# Patient Record
Sex: Male | Born: 2000 | Race: Asian | Hispanic: No | Marital: Single | Smoking: Never smoker
Health system: Southern US, Community
[De-identification: ages and names within clinical notes are randomized; demographics above are authoritative.]

## PROBLEM LIST (undated history)

## (undated) DIAGNOSIS — K589 Irritable bowel syndrome without diarrhea: Secondary | ICD-10-CM

## (undated) DIAGNOSIS — R109 Unspecified abdominal pain: Secondary | ICD-10-CM

## (undated) DIAGNOSIS — R1110 Vomiting, unspecified: Secondary | ICD-10-CM

## (undated) DIAGNOSIS — K219 Gastro-esophageal reflux disease without esophagitis: Secondary | ICD-10-CM

## (undated) DIAGNOSIS — D869 Sarcoidosis, unspecified: Secondary | ICD-10-CM

## (undated) HISTORY — DX: Irritable bowel syndrome, unspecified: K58.9

## (undated) HISTORY — DX: Vomiting, unspecified: R11.10

## (undated) HISTORY — PX: WISDOM TOOTH EXTRACTION: SHX21

## (undated) HISTORY — DX: Gastro-esophageal reflux disease without esophagitis: K21.9

## (undated) HISTORY — DX: Sarcoidosis, unspecified: D86.9

## (undated) HISTORY — DX: Unspecified abdominal pain: R10.9

---
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
## 2019-04-23 ENCOUNTER — Encounter: Payer: Self-pay | Admitting: *Deleted

## 2019-04-24 ENCOUNTER — Encounter

## 2019-04-24 ENCOUNTER — Telehealth: Payer: Self-pay | Admitting: General Surgery

## 2019-04-24 ENCOUNTER — Encounter: Payer: Self-pay | Admitting: Gastroenterology

## 2019-04-24 ENCOUNTER — Ambulatory Visit (INDEPENDENT_AMBULATORY_CARE_PROVIDER_SITE_OTHER): Payer: Medicaid Other | Admitting: Gastroenterology

## 2019-04-24 VITALS — Ht 64.0 in | Wt 100.0 lb

## 2019-04-24 DIAGNOSIS — K219 Gastro-esophageal reflux disease without esophagitis: Secondary | ICD-10-CM | POA: Diagnosis not present

## 2019-04-24 DIAGNOSIS — D863 Sarcoidosis of skin: Secondary | ICD-10-CM | POA: Diagnosis not present

## 2019-04-24 DIAGNOSIS — R12 Heartburn: Secondary | ICD-10-CM

## 2019-04-24 DIAGNOSIS — R112 Nausea with vomiting, unspecified: Secondary | ICD-10-CM

## 2019-04-24 MED ORDER — NA SULFATE-K SULFATE-MG SULF 17.5-3.13-1.6 GM/177ML PO SOLN: 1.0000 | Freq: Once | ORAL | 0 refills | Status: AC

## 2019-04-24 NOTE — Telephone Encounter (Signed)
Spoke with the patients mother and schedule EGD/Colon for 05/21/2019. Email instructions to patient at [email or web address]. I also mailed them the copies of the procedure instructions, and the consent form with return envelope.

## 2019-04-24 NOTE — Progress Notes (Signed)
Daniel [name]    [id number]    [birth year]  Primary Care Physician:Davis, Jenne Pane, MD  Referring Physician: Hall Busing, MD [address] [name],  [name] [address]  This service was provided via  telemedicine due to [name].  I connected with Daniel [name] on 04/24/19 at  9:30 AM EDT by a video enabled telemedicine application and verified that I am speaking with the correct person using two identifiers.  Patient location: Home Provider location: Office   I discussed the limitations, risks, security and privacy concerns of performing an evaluation and management service by video enabled telemedicine application and the availability of in person appointments. I also discussed with the patient that there may be a patient responsible charge related to this service. The patient expressed understanding and agreed to proceed.   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  His symptoms started about 4 to 6 weeks ago, was unable to keep anything down even liquids for few days.  He had to stop eating completely.  Symptoms slowly improved after he started taking Prilosec. Denies any change in bowel habits or diarrhea.  No melena or blood per rectum His grandmother had sarcoidosis but no family history of IBD or GI malignancy Denies over-the-counter herbal remedies or marijuana.  Occasional NSAID use but denies taking NSAIDs daily. He lost significant weight last month, is slowly improving.    Outpatient Encounter Medications as of 04/24/2019  Medication Sig  . omeprazole (PRILOSEC) 40 MG capsule Take 40 mg by mouth daily.  . Probiotic Product (PROBIOTIC DAILY PO) Take by mouth.   No facility-administered encounter medications on  file as of 04/24/2019.     Allergies as of 04/24/2019  . (No Known Allergies)    Past Medical History:  Diagnosis Date  . Abdominal pain, recurrent   . GERD (gastroesophageal reflux disease)   . Premature birth   . Vomiting     History reviewed. No pertinent surgical history.  Family History  Problem Relation Age of Onset  . GER disease Paternal Grandmother     Social History   Socioeconomic History  . Marital status: Single    Spouse name: Not on file  . Number of children: Not on file  . Years of education: Not on file  . Highest education level: Not on file  Occupational History  . Not on file  Social Needs  . Financial resource strain: Not on file  . Food insecurity    Worry: Not on file    Inability: Not on file  . Transportation needs    Medical: Not on file    Non-medical: Not on file  Tobacco Use  . Smoking status: Never Smoker  . Smokeless tobacco: Never Used  Substance and Sexual Activity  . Alcohol use: Not on file  . Drug use: No  . Sexual activity: Never  Lifestyle  . Physical activity    Days per week: Not on file    Minutes per session: Not on file  . Stress: Not on file  Relationships  . Social Herbalist on phone: Not on file    Gets together: Not on file    Attends religious service: Not on file  Active member of club or organization: Not on file    Attends meetings of clubs or organizations: Not on file    Relationship status: Not on file  . Intimate partner violence    Fear of current or ex partner: Not on file    Emotionally abused: Not on file    Physically abused: Not on file    Forced sexual activity: Not on file  Other Topics Concern  . Not on file  Social History Narrative   6th grade      Review of systems: Review of Systems as per HPI All other systems reviewed and are negative.   Observations/Objective:   Data Reviewed:  Reviewed labs, radiology imaging, old records and pertinent past GI work up    Assessment and Plan/Recommendations: 18 year old male with cutaneous sarcoidosis complains of severe heartburn, nausea and intractable vomiting ?  GI involvement of sarcoid We will schedule for EGD and colonoscopy with biopsies for further evaluation Continue omeprazole 40 mg daily, 30 minutes before breakfast Discussed antireflux measures and lifestyle modifications in detail  The risks and benefits as well as alternatives of endoscopic procedure(s) have been discussed and reviewed. All questions answered. The patient agrees to proceed.  I discussed the assessment and treatment plan with the patient. The patient was provided an opportunity to ask questions and all were answered. The patient agreed with the plan and demonstrated an understanding of the instructions.   The patient was advised to call back or seek an in-person evaluation if the symptoms worsen or if the condition fails to improve as anticipated.     Marsa ArisKavitha , MD   CC: Estrella Myrtleavis, William B, MD

## 2019-04-24 NOTE — Patient Instructions (Addendum)
Continue omeprazole 40 mg daily, 30 minutes before breakfast  Antireflux measures  Schedule for EGD and colonoscopy with biopsy    Gastroesophageal Reflux Disease, Adult Gastroesophageal reflux (GER) happens when acid from the stomach flows up into the tube that connects the mouth and the stomach (esophagus). Normally, food travels down the esophagus and stays in the stomach to be digested. However, when a person has GER, food and stomach acid sometimes move back up into the esophagus. If this becomes a more serious problem, the person may be diagnosed with a disease called gastroesophageal reflux disease (GERD). GERD occurs when the reflux:  Happens often.  Causes frequent or severe symptoms.  Causes problems such as damage to the esophagus. When stomach acid comes in contact with the esophagus, the acid may cause soreness (inflammation) in the esophagus. Over time, GERD may create small holes (ulcers) in the lining of the esophagus. What are the causes? This condition is caused by a problem with the muscle between the esophagus and the stomach (lower esophageal sphincter, or LES). Normally, the LES muscle closes after food passes through the esophagus to the stomach. When the LES is weakened or abnormal, it does not close properly, and that allows food and stomach acid to go back up into the esophagus. The LES can be weakened by certain dietary substances, medicines, and medical conditions, including:  Tobacco use.  Pregnancy.  Having a hiatal hernia.  Alcohol use.  Certain foods and beverages, such as coffee, chocolate, onions, and peppermint. What increases the risk? You are more likely to develop this condition if you:  Have an increased body weight.  Have a connective tissue disorder.  Use NSAID medicines. What are the signs or symptoms? Symptoms of this condition include:  Heartburn.  Difficult or painful swallowing.  The feeling of having a lump in the  throat.  Abitter taste in the mouth.  Bad breath.  Having a large amount of saliva.  Having an upset or bloated stomach.  Belching.  Chest pain. Different conditions can cause chest pain. Make sure you see your health care provider if you experience chest pain.  Shortness of breath or wheezing.  Ongoing (chronic) cough or a night-time cough.  Wearing away of tooth enamel.  Weight loss. How is this diagnosed? Your health care provider will take a medical history and perform a physical exam. To determine if you have mild or severe GERD, your health care provider may also monitor how you respond to treatment. You may also have tests, including:  A test to examine your stomach and esophagus with a small camera (endoscopy).  A test thatmeasures the acidity level in your esophagus.  A test thatmeasures how much pressure is on your esophagus.  A barium swallow or modified barium swallow test to show the shape, size, and functioning of your esophagus. How is this treated? The goal of treatment is to help relieve your symptoms and to prevent complications. Treatment for this condition may vary depending on how severe your symptoms are. Your health care provider may recommend:  Changes to your diet.  Medicine.  Surgery. Follow these instructions at home: Eating and drinking   Follow a diet as recommended by your health care provider. This may involve avoiding foods and drinks such as: ? Coffee and tea (with or without caffeine). ? Drinks that containalcohol. ? Energy drinks and sports drinks. ? Carbonated drinks or sodas. ? Chocolate and cocoa. ? Peppermint and mint flavorings. ? Garlic and onions. ? Horseradish. ?  Spicy and acidic foods, including peppers, chili powder, curry powder, vinegar, hot sauces, and barbecue sauce. ? Citrus fruit juices and citrus fruits, such as oranges, lemons, and limes. ? Tomato-based foods, such as red sauce, chili, salsa, and pizza with  red sauce. ? Fried and fatty foods, such as donuts, french fries, potato chips, and high-fat dressings. ? High-fat meats, such as hot dogs and fatty cuts of red and white meats, such as rib eye steak, sausage, ham, and bacon. ? High-fat dairy items, such as whole milk, butter, and cream cheese.  Eat small, frequent meals instead of large meals.  Avoid drinking large amounts of liquid with your meals.  Avoid eating meals during the 2-3 hours before bedtime.  Avoid lying down right after you eat.  Do not exercise right after you eat. Lifestyle   Do not use any products that contain nicotine or tobacco, such as cigarettes, e-cigarettes, and chewing tobacco. If you need help quitting, ask your health care provider.  Try to reduce your stress by using methods such as yoga or meditation. If you need help reducing stress, ask your health care provider.  If you are overweight, reduce your weight to an amount that is healthy for you. Ask your health care provider for guidance about a safe weight loss goal. General instructions  Pay attention to any changes in your symptoms.  Take over-the-counter and prescription medicines only as told by your health care provider. Do not take aspirin, ibuprofen, or other NSAIDs unless your health care provider told you to do so.  Wear loose-fitting clothing. Do not wear anything tight around your waist that causes pressure on your abdomen.  Raise (elevate) the head of your bed about 6 inches (15 cm).  Avoid bending over if this makes your symptoms worse.  Keep all follow-up visits as told by your health care provider. This is important. Contact a health care provider if:  You have: ? New symptoms. ? Unexplained weight loss. ? Difficulty swallowing or it hurts to swallow. ? Wheezing or a persistent cough. ? A hoarse voice.  Your symptoms do not improve with treatment. Get help right away if you:  Have pain in your arms, neck, jaw, teeth, or  back.  Feel sweaty, dizzy, or light-headed.  Have chest pain or shortness of breath.  Vomit and your vomit looks like blood or coffee grounds.  Faint.  Have stool that is bloody or black.  Cannot swallow, drink, or eat. Summary  Gastroesophageal reflux happens when acid from the stomach flows up into the esophagus. GERD is a disease in which the reflux happens often, causes frequent or severe symptoms, or causes problems such as damage to the esophagus.  Treatment for this condition may vary depending on how severe your symptoms are. Your health care provider may recommend diet and lifestyle changes, medicine, or surgery.  Contact a health care provider if you have new or worsening symptoms.  Take over-the-counter and prescription medicines only as told by your health care provider. Do not take aspirin, ibuprofen, or other NSAIDs unless your health care provider told you to do so.  Keep all follow-up visits as told by your health care provider. This is important. This information is not intended to replace advice given to you by your health care provider. Make sure you discuss any questions you have with your health care provider. Document Released: 06/21/2005 Document Revised: 03/20/2018 Document Reviewed: 03/20/2018 Elsevier Patient Education  2020 [employer]   I appreciate the  opportunity to care for you  Thank You   Harl Bowie , MD

## 2019-04-28 DIAGNOSIS — K219 Gastro-esophageal reflux disease without esophagitis: Secondary | ICD-10-CM | POA: Insufficient documentation

## 2019-05-20 ENCOUNTER — Telehealth: Payer: Self-pay | Admitting: Gastroenterology

## 2019-05-20 NOTE — Telephone Encounter (Signed)
Spoke with patient regarding Covid-19 screening questions. °Covid-19 Screening Questions: ° °Do you now or have you had a fever in the last 14 days?  ° °Do you have any respiratory symptoms of shortness of breath or cough now or in the last 14 days?  ° °Do you have any family members or close contacts with diagnosed or suspected Covid-19 in the past 14 days?  ° °Have you been tested for Covid-19 and found to be positive?  ° °Pt made aware of that care partner may wait in the car or come up to the lobby during the procedure but will need to provide their own mask. °

## 2019-05-21 ENCOUNTER — Other Ambulatory Visit: Payer: Self-pay

## 2019-05-21 ENCOUNTER — Ambulatory Visit (AMBULATORY_SURGERY_CENTER): Payer: Medicaid Other | Admitting: Gastroenterology

## 2019-05-21 ENCOUNTER — Encounter: Payer: Self-pay | Admitting: Gastroenterology

## 2019-05-21 VITALS — BP 94/61 | HR 58 | Temp 97.8°F | Resp 14 | Ht 64.0 in | Wt 100.0 lb

## 2019-05-21 DIAGNOSIS — R12 Heartburn: Secondary | ICD-10-CM

## 2019-05-21 DIAGNOSIS — D863 Sarcoidosis of skin: Secondary | ICD-10-CM

## 2019-05-21 DIAGNOSIS — K2970 Gastritis, unspecified, without bleeding: Secondary | ICD-10-CM | POA: Diagnosis not present

## 2019-05-21 DIAGNOSIS — R112 Nausea with vomiting, unspecified: Secondary | ICD-10-CM | POA: Diagnosis not present

## 2019-05-21 DIAGNOSIS — K6289 Other specified diseases of anus and rectum: Secondary | ICD-10-CM | POA: Diagnosis not present

## 2019-05-21 DIAGNOSIS — R634 Abnormal weight loss: Secondary | ICD-10-CM | POA: Diagnosis not present

## 2019-05-21 DIAGNOSIS — R627 Adult failure to thrive: Secondary | ICD-10-CM

## 2019-05-21 MED ORDER — SODIUM CHLORIDE 0.9 % IV SOLN: 500.0000 mL | Freq: Once | INTRAVENOUS | Status: DC

## 2019-05-21 NOTE — Op Note (Signed)
[facility] Patient Name: Daniel [name] Procedure Date: 05/21/2019 3:34 PM MRN: [id number] Endoscopist: Mauri Pole , MD Age: 18 Referring MD:  Date of Birth: 15-Aug-2001 Gender: Male Account #: [id number] Procedure:                Upper GI endoscopy Indications:              Persistent vomiting of unknown cause, Heartburn,                            history of sarcoidosis Medicines:                Monitored Anesthesia Care Procedure:                Pre-Anesthesia Assessment:                           - Prior to the procedure, a History and Physical                            was performed, and patient medications and                            allergies were reviewed. The patient's tolerance of                            previous anesthesia was also reviewed. The risks                            and benefits of the procedure and the sedation                            options and risks were discussed with the patient.                            All questions were answered, and informed consent                            was obtained. Prior Anticoagulants: The patient has                            taken no previous anticoagulant or antiplatelet                            agents. ASA Grade Assessment: II - A patient with                            mild systemic disease. After reviewing the risks                            and benefits, the patient was deemed in                            satisfactory condition to undergo the procedure.  After obtaining informed consent, the endoscope was                            passed under direct vision. Throughout the                            procedure, the patient's blood pressure, pulse, and                            oxygen saturations were monitored continuously. The                            Endoscope was introduced through the mouth, and                            advanced to the second part of  duodenum. The upper                            GI endoscopy was accomplished without difficulty.                            The patient tolerated the procedure well. Scope In: Scope Out: Findings:                 The esophagus was normal.                           Patchy mild inflammation characterized by                            congestion (edema) and erythema was found in the                            entire examined stomach. Biopsies were taken with a                            cold forceps for Helicobacter pylori testing.                           The first portion of the duodenum and second                            portion of the duodenum were normal. Biopsies were                            taken with a cold forceps for histology. Complications:            No immediate complications. Estimated Blood Loss:     Estimated blood loss was minimal. Impression:               - Normal esophagus.                           - Gastritis. Biopsied.                           -  Normal first portion of the duodenum and second                            portion of the duodenum. Biopsied. Recommendation:           - Resume previous diet.                           - Continue present medications.                           - Await pathology results. Napoleon Form, MD 05/21/2019 4:06:18 PM This report has been signed electronically.

## 2019-05-21 NOTE — Progress Notes (Signed)
Called to room to assist during endoscopic procedure.  Patient ID and intended procedure confirmed with present staff. Received instructions for my participation in the procedure from the performing physician.  

## 2019-05-21 NOTE — Progress Notes (Signed)
PT taken to PACU. Monitors in place. VSS. Report given to RN. 

## 2019-05-21 NOTE — Op Note (Signed)
[facility] Patient Name: Daniel SouSahil Veldman Procedure Date: 05/21/2019 3:34 PM MRN: [id number] Endoscopist: Napoleon FormKavitha V. Nandigam , MD Age: [age] Referring MD:  Date of Birth: October 26, 2000 Gender: Male Account #: [id number] Procedure:                Colonoscopy Indications:              Generalized abdominal pain, Failure to thrive,                            Weight loss, history of sarcoidosis Medicines:                Monitored Anesthesia Care Procedure:                Pre-Anesthesia Assessment:                           - Prior to the procedure, a History and Physical                            was performed, and patient medications and                            allergies were reviewed. The patient's tolerance of                            previous anesthesia was also reviewed. The risks                            and benefits of the procedure and the sedation                            options and risks were discussed with the patient.                            All questions were answered, and informed consent                            was obtained. Prior Anticoagulants: The patient has                            taken no previous anticoagulant or antiplatelet                            agents. ASA Grade Assessment: II - A patient with                            mild systemic disease. After reviewing the risks                            and benefits, the patient was deemed in                            satisfactory condition to undergo the procedure.  After obtaining informed consent, the colonoscope                            was passed under direct vision. Throughout the                            procedure, the patient's blood pressure, pulse, and                            oxygen saturations were monitored continuously. The                            Colonoscope was introduced through the anus and                            advanced to the the  terminal ileum, with                            identification of the appendiceal orifice and IC                            valve. The colonoscopy was performed without                            difficulty. The patient tolerated the procedure                            well. The quality of the bowel preparation was                            adequate. The terminal ileum, ileocecal valve,                            appendiceal orifice, and rectum were photographed. Scope In: 3:45:54 PM Scope Out: 3:57:52 PM Scope Withdrawal Time: 0 hours 8 minutes 3 seconds  Total Procedure Duration: 0 hours 11 minutes 58 seconds  Findings:                 The perianal and digital rectal examinations were                            normal.                           A patchy area of mildly erythematous mucosa was                            found in the rectum. Biopsies were taken with a                            cold forceps for histology.                           The exam was otherwise without abnormality, rest of  colon appeared normal. Biopsies were taken with a                            cold forceps for histology.                           The terminal ileum appeared normal. Biopsies were                            taken with a cold forceps for histology. Complications:            No immediate complications. Estimated Blood Loss:     Estimated blood loss was minimal. Impression:               - Erythematous mucosa in the rectum. Biopsied.                           - The examination was otherwise normal.                           - The examined portion of the ileum was normal.                            Biopsied. Recommendation:           - Patient has a contact number available for                            emergencies. The signs and symptoms of potential                            delayed complications were discussed with the                            patient. Return  to normal activities tomorrow.                            Written discharge instructions were provided to the                            patient.                           - Resume previous diet.                           - Continue present medications.                           - Await pathology results.                           - Repeat colonoscopy date to be determined after                            pending pathology results are reviewed for  surveillance based on pathology results.                           - Return to GI clinic at the next available                            appointment. Napoleon Form, MD 05/21/2019 4:12:09 PM This report has been signed electronically.

## 2019-05-21 NOTE — Patient Instructions (Signed)
YOU HAD AN ENDOSCOPIC PROCEDURE TODAY AT THE [facility]:   Refer to the procedure report that was given to you for any specific questions about what was found during the examination.  If the procedure report does not answer your questions, please call your gastroenterologist to clarify.  If you requested that your care partner not be given the details of your procedure findings, then the procedure report has been included in a sealed envelope for you to review at your convenience later.  YOU SHOULD EXPECT: Some feelings of bloating in the abdomen. Passage of more gas than usual.  Walking can help get rid of the air that was put into your GI tract during the procedure and reduce the bloating. If you had a lower endoscopy (such as a colonoscopy or flexible sigmoidoscopy) you may notice spotting of blood in your stool or on the toilet paper. If you underwent a bowel prep for your procedure, you may not have a normal bowel movement for a few days.  Please Note:  You might notice some irritation and congestion in your nose or some drainage.  This is from the oxygen used during your procedure.  There is no need for concern and it should clear up in a day or so.  SYMPTOMS TO REPORT IMMEDIATELY:   Following lower endoscopy (colonoscopy or flexible sigmoidoscopy):  Excessive amounts of blood in the stool  Significant tenderness or worsening of abdominal pains  Swelling of the abdomen that is new, acute  Fever of 100F or higher   Following upper endoscopy (EGD)  Vomiting of blood or coffee ground material  New chest pain or pain under the shoulder blades  Painful or persistently difficult swallowing  New shortness of breath  Fever of 100F or higher  Black, tarry-looking stools  For urgent or emergent issues, a gastroenterologist can be reached at any hour by calling [phone].   DIET:  We do recommend a small meal at first, but then you may proceed to your regular diet.  Drink  plenty of fluids but you should avoid alcoholic beverages for 24 hours.  ACTIVITY:  You should plan to take it easy for the rest of today and you should NOT DRIVE or use heavy machinery until tomorrow (because of the sedation medicines used during the test).    FOLLOW UP: Our staff will call the number listed on your records 48-72 hours following your procedure to check on you and address any questions or concerns that you may have regarding the information given to you following your procedure. If we do not reach you, we will leave a message.  We will attempt to reach you two times.  During this call, we will ask if you have developed any symptoms of COVID 19. If you develop any symptoms (ie: fever, flu-like symptoms, shortness of breath, cough etc.) before then, please call [phone].  If you test positive for Covid 19 in the 2 weeks post procedure, please call and report this information to us.    If any biopsies were taken you will be contacted by phone or by letter within the next 1-3 weeks.  Please call us at [phone] if you have not heard about the biopsies in 3 weeks.    SIGNATURES/CONFIDENTIALITY: You and/or your care partner have signed paperwork which will be entered into your electronic medical record.  These signatures attest to the fact that that the information above on your After Visit Summary has been reviewed and is   understood.  Full responsibility of the confidentiality of this discharge information lies with you and/or your care-partner.  Await pathology  Please read over handout about gastritis  Please continue your normal medications  Dr. Woodward Ku office nurse will call you to set up follow up office visit

## 2019-05-21 NOTE — Progress Notes (Signed)
VS done by CW. Covid screening and temp done by June. 

## 2019-05-23 ENCOUNTER — Telehealth: Payer: Self-pay | Admitting: *Deleted

## 2019-05-23 NOTE — Telephone Encounter (Signed)
  Follow up Call-  Call back number 05/21/2019  Post procedure Call Back phone  # [phone]  Permission to leave phone message Yes  Some recent data might be hidden   spoke with mother  Patient questions:  Do you have a fever, pain , or abdominal swelling? No. Pain Score  0 *  Have you tolerated food without any problems? Yes.    Have you been able to return to your normal activities? Yes.    Do you have any questions about your discharge instructions: Diet   No. Medications  No. Follow up visit  No.  Do you have questions or concerns about your Care? No.  Actions: * If pain score is 4 or above: No action needed, pain <4.  1. Have you developed a fever since your procedure? no  2.   Have you had an respiratory symptoms (SOB or cough) since your procedure? no  3.   Have you tested positive for COVID 19 since your procedure no  4.   Have you had any family members/close contacts diagnosed with the COVID 19 since your procedure?  no   If yes to any of these questions please route to Joylene John, RN and Alphonsa Gin, Therapist, sports.

## 2019-05-27 ENCOUNTER — Other Ambulatory Visit: Payer: Self-pay

## 2019-06-04 ENCOUNTER — Encounter: Payer: Self-pay | Admitting: Gastroenterology

## 2019-07-01 ENCOUNTER — Encounter: Payer: Self-pay | Admitting: Gastroenterology

## 2019-07-01 ENCOUNTER — Ambulatory Visit (INDEPENDENT_AMBULATORY_CARE_PROVIDER_SITE_OTHER): Payer: Medicaid Other | Admitting: Gastroenterology

## 2019-07-01 ENCOUNTER — Other Ambulatory Visit: Payer: Self-pay

## 2019-07-01 VITALS — BP 90/60 | HR 72 | Temp 98.2°F | Ht 64.0 in | Wt 99.0 lb

## 2019-07-01 DIAGNOSIS — K219 Gastro-esophageal reflux disease without esophagitis: Secondary | ICD-10-CM | POA: Diagnosis not present

## 2019-07-01 DIAGNOSIS — R1013 Epigastric pain: Secondary | ICD-10-CM | POA: Diagnosis not present

## 2019-07-01 MED ORDER — OMEPRAZOLE 20 MG PO CPDR: 20.0000 mg | DELAYED_RELEASE_CAPSULE | Freq: Every day | ORAL | 3 refills | Status: DC

## 2019-07-01 NOTE — Progress Notes (Signed)
Daniel [name]    [id number]    Jan 19, 2001  Primary Care Physician:Davis, Jenne Pane, MD  Referring Physician: Hall Busing, MD [address] [place],  [place] [address]   Chief complaint:  GERD  HPI:  18 yr M with h/o sarcoidosis is here for follow up visit  He is doing well overall. He has gained 4-5 lbs in the past few months.   Continues to have intermittent nausea mostly in the morning and is unable to eat anything solid for couple hours after he wakes up.  He tolerates liquids fine in the morning.  Denies any nausea, vomiting, abdominal pain, melena or bright red blood per rectum  EGD May 21, 2019: Mild gastritis otherwise normal.  Biopsies negative for H. pylori or celiac sprue  Colonoscopy May 21, 2019: Normal.  Normal TI and colon biopsies  Outpatient Encounter Medications as of 07/01/2019  Medication Sig  . omeprazole (PRILOSEC) 40 MG capsule Take 40 mg by mouth daily.  . Probiotic Product (PROBIOTIC DAILY PO) Take by mouth.   Facility-Administered Encounter Medications as of 07/01/2019  Medication  . 0.9 %  sodium chloride infusion    Allergies as of 07/01/2019  . (No Known Allergies)    Past Medical History:  Diagnosis Date  . Abdominal pain, recurrent   . GERD (gastroesophageal reflux disease)   . Premature birth   . Vomiting     No past surgical history on file.  Family History  Problem Relation Age of Onset  . GER disease Paternal Grandmother   . Colon cancer Neg Hx   . Esophageal cancer Neg Hx   . Rectal cancer Neg Hx   . Stomach cancer Neg Hx     Social History   Socioeconomic History  . Marital status: Single    Spouse name: Not on file  . Number of children: Not on file  . Years of education: Not on file  . Highest education level: Not on file  Occupational History  . Not on file  Social Needs  . Financial resource strain: Not on file  . Food insecurity    Worry: Not on file    Inability: Not on file   . Transportation needs    Medical: Not on file    Non-medical: Not on file  Tobacco Use  . Smoking status: Never Smoker  . Smokeless tobacco: Never Used  Substance and Sexual Activity  . Alcohol use: Never    Frequency: Never  . Drug use: No  . Sexual activity: Never  Lifestyle  . Physical activity    Days per week: Not on file    Minutes per session: Not on file  . Stress: Not on file  Relationships  . Social Herbalist on phone: Not on file    Gets together: Not on file    Attends religious service: Not on file    Active member of club or organization: Not on file    Attends meetings of clubs or organizations: Not on file    Relationship status: Not on file  . Intimate partner violence    Fear of current or ex partner: Not on file    Emotionally abused: Not on file    Physically abused: Not on file    Forced sexual activity: Not on file  Other Topics Concern  . Not on file  Social History Narrative   6th grade  Review of systems: Review of Systems  Constitutional: Negative for fever and chills.  HENT: Negative.   Eyes: Negative for blurred vision.  Respiratory: Negative for cough, shortness of breath and wheezing.   Cardiovascular: Negative for chest pain and palpitations.  Gastrointestinal: as per HPI Genitourinary: Negative for dysuria, urgency, frequency and hematuria.  Musculoskeletal: Negative for myalgias, back pain and joint pain.  Skin: Negative for itching and rash.  Neurological: Negative for dizziness, tremors, focal weakness, seizures and loss of consciousness.  Endo/Heme/Allergies: Positive for seasonal allergies.  Psychiatric/Behavioral: Negative for depression, suicidal ideas and hallucinations.  All other systems reviewed and are negative.   Physical Exam: Vitals:   07/01/19 0837  BP: 90/60  Pulse: 72  Temp: 98.2 F (36.8 C)   Body mass index is 16.99 kg/m. Gen:      No acute distress HEENT:  EOMI, sclera anicteric  Neck:     No masses; no thyromegaly Lungs:    Clear to auscultation bilaterally; normal respiratory effort CV:         Regular rate and rhythm; no murmurs Abd:      + bowel sounds; soft, non-tender; no palpable masses, no distension Ext:    No edema; adequate peripheral perfusion Skin:      Warm and dry; no rash Neuro: alert and oriented x 3 Psych: normal mood and affect  Data Reviewed:  Reviewed labs, radiology imaging, old records and pertinent past GI work up   Assessment and Plan/Recommendations:  18 year old male with history of sarcoidosis with GERD and dyspepsia  Omeprazole 40 mg daily, will plan to slowly taper it down as his symptoms improve  Small frequent meals  Discussed antireflux measures and lifestyle modifications in detail  Return in 1 year or sooner if needed  15 minutes was spent face-to-face with the patient. Greater than 50% of the time used for counseling as well as treatment plan and follow-up.   Iona Beard , MD    CC: Estrella Myrtle, MD

## 2019-07-01 NOTE — Patient Instructions (Signed)
We refilled your Omeprazole today  Eat Small frequent meals   Gastroesophageal Reflux Disease, Adult Gastroesophageal reflux (GER) happens when acid from the stomach flows up into the tube that connects the mouth and the stomach (esophagus). Normally, food travels down the esophagus and stays in the stomach to be digested. However, when a person has GER, food and stomach acid sometimes move back up into the esophagus. If this becomes a more serious problem, the person may be diagnosed with a disease called gastroesophageal reflux disease (GERD). GERD occurs when the reflux:  Happens often.  Causes frequent or severe symptoms.  Causes problems such as damage to the esophagus. When stomach acid comes in contact with the esophagus, the acid may cause soreness (inflammation) in the esophagus. Over time, GERD may create small holes (ulcers) in the lining of the esophagus. What are the causes? This condition is caused by a problem with the muscle between the esophagus and the stomach (lower esophageal sphincter, or LES). Normally, the LES muscle closes after food passes through the esophagus to the stomach. When the LES is weakened or abnormal, it does not close properly, and that allows food and stomach acid to go back up into the esophagus. The LES can be weakened by certain dietary substances, medicines, and medical conditions, including:  Tobacco use.  Pregnancy.  Having a hiatal hernia.  Alcohol use.  Certain foods and beverages, such as coffee, chocolate, onions, and peppermint. What increases the risk? You are more likely to develop this condition if you:  Have an increased body weight.  Have a connective tissue disorder.  Use NSAID medicines. What are the signs or symptoms? Symptoms of this condition include:  Heartburn.  Difficult or painful swallowing.  The feeling of having a lump in the throat.  Abitter taste in the mouth.  Bad breath.  Having a large amount of  saliva.  Having an upset or bloated stomach.  Belching.  Chest pain. Different conditions can cause chest pain. Make sure you see your health care provider if you experience chest pain.  Shortness of breath or wheezing.  Ongoing (chronic) cough or a night-time cough.  Wearing away of tooth enamel.  Weight loss. How is this diagnosed? Your health care provider will take a medical history and perform a physical exam. To determine if you have mild or severe GERD, your health care provider may also monitor how you respond to treatment. You may also have tests, including:  A test to examine your stomach and esophagus with a small camera (endoscopy).  A test thatmeasures the acidity level in your esophagus.  A test thatmeasures how much pressure is on your esophagus.  A barium swallow or modified barium swallow test to show the shape, size, and functioning of your esophagus. How is this treated? The goal of treatment is to help relieve your symptoms and to prevent complications. Treatment for this condition may vary depending on how severe your symptoms are. Your health care provider may recommend:  Changes to your diet.  Medicine.  Surgery. Follow these instructions at home: Eating and drinking   Follow a diet as recommended by your health care provider. This may involve avoiding foods and drinks such as: ? Coffee and tea (with or without caffeine). ? Drinks that containalcohol. ? Energy drinks and sports drinks. ? Carbonated drinks or sodas. ? Chocolate and cocoa. ? Peppermint and mint flavorings. ? Garlic and onions. ? Horseradish. ? Spicy and acidic foods, including peppers, chili powder, curry powder,  vinegar, hot sauces, and barbecue sauce. ? Citrus fruit juices and citrus fruits, such as oranges, lemons, and limes. ? Tomato-based foods, such as red sauce, chili, salsa, and pizza with red sauce. ? Fried and fatty foods, such as donuts, french fries, potato chips,  and high-fat dressings. ? High-fat meats, such as hot dogs and fatty cuts of red and white meats, such as rib eye steak, sausage, ham, and bacon. ? High-fat dairy items, such as whole milk, butter, and cream cheese.  Eat small, frequent meals instead of large meals.  Avoid drinking large amounts of liquid with your meals.  Avoid eating meals during the 2-3 hours before bedtime.  Avoid lying down right after you eat.  Do not exercise right after you eat. Lifestyle   Do not use any products that contain nicotine or tobacco, such as cigarettes, e-cigarettes, and chewing tobacco. If you need help quitting, ask your health care provider.  Try to reduce your stress by using methods such as yoga or meditation. If you need help reducing stress, ask your health care provider.  If you are overweight, reduce your weight to an amount that is healthy for you. Ask your health care provider for guidance about a safe weight loss goal. General instructions  Pay attention to any changes in your symptoms.  Take over-the-counter and prescription medicines only as told by your health care provider. Do not take aspirin, ibuprofen, or other NSAIDs unless your health care provider told you to do so.  Wear loose-fitting clothing. Do not wear anything tight around your waist that causes pressure on your abdomen.  Raise (elevate) the head of your bed about 6 inches (15 cm).  Avoid bending over if this makes your symptoms worse.  Keep all follow-up visits as told by your health care provider. This is important. Contact a health care provider if:  You have: ? New symptoms. ? Unexplained weight loss. ? Difficulty swallowing or it hurts to swallow. ? Wheezing or a persistent cough. ? A hoarse voice.  Your symptoms do not improve with treatment. Get help right away if you:  Have pain in your arms, neck, jaw, teeth, or back.  Feel sweaty, dizzy, or light-headed.  Have chest pain or shortness of  breath.  Vomit and your vomit looks like blood or coffee grounds.  Faint.  Have stool that is bloody or black.  Cannot swallow, drink, or eat. Summary  Gastroesophageal reflux happens when acid from the stomach flows up into the esophagus. GERD is a disease in which the reflux happens often, causes frequent or severe symptoms, or causes problems such as damage to the esophagus.  Treatment for this condition may vary depending on how severe your symptoms are. Your health care provider may recommend diet and lifestyle changes, medicine, or surgery.  Contact a health care provider if you have new or worsening symptoms.  Take over-the-counter and prescription medicines only as told by your health care provider. Do not take aspirin, ibuprofen, or other NSAIDs unless your health care provider told you to do so.  Keep all follow-up visits as told by your health care provider. This is important. This information is not intended to replace advice given to you by your health care provider. Make sure you discuss any questions you have with your health care provider. Document Released: 06/21/2005 Document Revised: 03/20/2018 Document Reviewed: 03/20/2018 Elsevier Patient Education  2020 [employer].   Food Choices for Gastroesophageal Reflux Disease, Adult When you have gastroesophageal reflux disease (  GERD), the foods you eat and your eating habits are very important. Choosing the right foods can help ease your discomfort. Think about working with a nutrition specialist (dietitian) to help you make good choices. What are tips for following this plan?  Meals  Choose healthy foods that are low in fat, such as fruits, vegetables, whole grains, low-fat dairy products, and lean meat, fish, and poultry.  Eat small meals often instead of 3 large meals a day. Eat your meals slowly, and in a place where you are relaxed. Avoid bending over or lying down until 2-3 hours after eating.  Avoid eating  meals 2-3 hours before bed.  Avoid drinking a lot of liquid with meals.  Cook foods using methods other than frying. Bake, grill, or broil food instead.  Avoid or limit: ? Chocolate. ? Peppermint or spearmint. ? Alcohol. ? Pepper. ? Black and decaffeinated coffee. ? Black and decaffeinated tea. ? Bubbly (carbonated) soft drinks. ? Caffeinated energy drinks and soft drinks.  Limit high-fat foods such as: ? Fatty meat or fried foods. ? Whole milk, cream, butter, or ice cream. ? Nuts and nut butters. ? Pastries, donuts, and sweets made with butter or shortening.  Avoid foods that cause symptoms. These foods may be different for everyone. Common foods that cause symptoms include: ? Tomatoes. ? Oranges, lemons, and limes. ? Peppers. ? Spicy food. ? Onions and garlic. ? Vinegar. Lifestyle  Maintain a healthy weight. Ask your doctor what weight is healthy for you. If you need to lose weight, work with your doctor to do so safely.  Exercise for at least 30 minutes for 5 or more days each week, or as told by your doctor.  Wear loose-fitting clothes.  Do not smoke. If you need help quitting, ask your doctor.  Sleep with the head of your bed higher than your feet. Use a wedge under the mattress or blocks under the bed frame to raise the head of the bed. Summary  When you have gastroesophageal reflux disease (GERD), food and lifestyle choices are very important in easing your symptoms.  Eat small meals often instead of 3 large meals a day. Eat your meals slowly, and in a place where you are relaxed.  Limit high-fat foods such as fatty meat or fried foods.  Avoid bending over or lying down until 2-3 hours after eating.  Avoid peppermint and spearmint, caffeine, alcohol, and chocolate. This information is not intended to replace advice given to you by your health care provider. Make sure you discuss any questions you have with your health care provider. Document Released:  03/12/2012 Document Revised: 01/02/2019 Document Reviewed: 10/17/2016 Elsevier Patient Education  [employer].   I appreciate the  opportunity to care for you  Thank You   Harl Bowie , MD

## 2019-07-03 ENCOUNTER — Encounter: Payer: Self-pay | Admitting: Gastroenterology

## 2020-05-10 ENCOUNTER — Other Ambulatory Visit: Payer: Self-pay | Admitting: Dermatology

## 2022-02-07 ENCOUNTER — Ambulatory Visit: Payer: Medicaid Other | Admitting: Internal Medicine

## 2022-02-07 ENCOUNTER — Encounter: Payer: Self-pay | Admitting: Internal Medicine

## 2022-02-07 DIAGNOSIS — D863 Sarcoidosis of skin: Secondary | ICD-10-CM | POA: Diagnosis not present

## 2022-02-07 DIAGNOSIS — K219 Gastro-esophageal reflux disease without esophagitis: Secondary | ICD-10-CM

## 2022-02-07 MED ORDER — OMEPRAZOLE 40 MG PO CPDR: 40.0000 mg | DELAYED_RELEASE_CAPSULE | Freq: Every day | ORAL | 1 refills | Status: DC

## 2022-02-07 NOTE — Progress Notes (Signed)
? ? ?Subjective:  ? ?Patient ID: Daniel [name] male   DOB: 2001/01/25 21 y.o.   MRN: [id number] ? ?HPI: ?[name] is a 21 y.o. with a PMHx of GERD and subcutaneous sarcoidosis who presents to establish care. Please see problem-based charting for full assessment and plan. ? ?Past Medical History:  ?Diagnosis Date  ? Abdominal pain, recurrent   ? GERD (gastroesophageal reflux disease)   ? Premature birth   ? Sarcoidosis   ? Subcutaneous  ? Vomiting   ? ?Current Outpatient Medications  ?Medication Sig Dispense Refill  ? ketoconazole (NIZORAL) 2 % cream Apply to affected area on chest and back twice daily for tinea versicolor. NEEDS APPT FOR FURTHER REFILLS 30 g 0  ? omeprazole (PRILOSEC) 20 MG capsule Take 1 capsule (20 mg total) by mouth daily. 90 capsule 3  ? omeprazole (PRILOSEC) 40 MG capsule Take 40 mg by mouth daily.    ? Probiotic Product (PROBIOTIC DAILY PO) Take by mouth.    ? ?Current Facility-Administered Medications  ?Medication Dose Route Frequency Provider Last Rate Last Admin  ? 0.9 %  sodium chloride infusion  500 mL Intravenous Once Nandigam, Eleonore Chiquito, MD      ? ?Family History  ?Problem Relation Age of Onset  ? Hypertension Maternal Grandmother   ? Sarcoidosis Maternal Grandmother   ? Diabetes Maternal Grandmother   ? Hyperlipidemia Maternal Grandmother   ? COPD Maternal Grandmother   ? Prostate cancer Maternal Grandfather   ? Neuropathy Maternal Grandfather   ? GER disease Paternal Grandmother   ? Hypertension Paternal Grandmother   ? Hyperlipidemia Paternal Grandmother   ? Thyroid disease Paternal Grandmother   ? Heart attack Paternal Grandfather   ? Colon cancer Neg Hx   ? Esophageal cancer Neg Hx   ? Rectal cancer Neg Hx   ? Stomach cancer Neg Hx   ? ?Social History  ? ?Socioeconomic History  ? Marital status: Single  ?  Spouse name: Not on file  ? Number of children: Not on file  ? Years of education: Not on file  ? Highest education level: Not on file  ?Occupational History  ? Not on  file  ?Tobacco Use  ? Smoking status: Never  ? Smokeless tobacco: Never  ?Vaping Use  ? Vaping Use: Never used  ?Substance and Sexual Activity  ? Alcohol use: Never  ? Drug use: No  ? Sexual activity: Never  ?Other Topics Concern  ? Not on file  ?Social History Narrative  ? Not on file  ? ?Social Determinants of Health  ? ?Financial Resource Strain: Not on file  ?Food Insecurity: Not on file  ?Transportation Needs: Not on file  ?Physical Activity: Not on file  ?Stress: Not on file  ?Social Connections: Not on file  ? ?Review of Systems: ?Pertinent items are noted in HPI. ?Objective:  ?Physical Exam: ?Vitals:  ? 02/07/22 1520  ?BP: 110/73  ?Pulse: 74  ?Temp: 98.2 ?F (36.8 ?C)  ?TempSrc: Oral  ?SpO2: 100%  ?Weight: 96 lb 6.4 oz (43.7 kg)  ?Height: 5\' 4"  (1.626 m)  ? ?BP 110/73 (BP Location: Left Arm, Patient Position: Sitting, Cuff Size: Normal)   Pulse 74   Temp 98.2 ?F (36.8 ?C) (Oral)   Ht 5\' 4"  (1.626 m)   Wt 96 lb 6.4 oz (43.7 kg)   SpO2 100%   BMI 16.55 kg/m?  ? ?General Appearance:    Alert, cooperative, no distress, appears stated age  ?Head:  Normocephalic, atraumatic  ?Lungs:     Clear to auscultation bilaterally, respirations unlabored  ?Chest wall:    No deformity, left-sided tenderness  ?Heart:    Regular rate and rhythm, S1 and S2 normal, no murmur, rub   or gallop  ?Abdomen:     Soft, tender to palpation down midline, bowel sounds active all four quadrants,  ?  no masses, no organomegaly  ?Extremities:   Extremities normal, atraumatic, no cyanosis or edema  ?Skin:   Skin color, texture, turgor normal, no rashes or lesions  ? ?Assessment & Plan:  ? ?See encounters tab for problem-based charting. ? ?Gastroesophageal reflux disease without esophagitis ?Patient has a history of GERD. York Spaniel he has recently eaten more spicy foods than normal as he finished his semester in school and prepared to graduate. He has taken omeprazole for reflux in the past as needed for 10-day courses but had not needed the  medication for more than 3 years. He says the abdominal discomfort he feels currently and soreness of his chest on the L side were presenting symptoms for GERD previously. He says he initially felt the pain in his LUQ, but currently, his chest hurts more than his abdomen currently. He denies any nausea, vomiting, recent fevers or illnesses, trauma. On exam, bowel sounds were normal, but he was tender to palpation down the midline. No other abnormalities. Symptoms consistent with GERD. Patient says he ran out of omeprazole recently, so ordered more today. ? ?Plan ?- Continue omeprazole 40mg  as needed ? 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 His nose was impacted by sarcoidosis but previous scans and studies were negative for chest involvement or systemic disease. He was seeing them once yearly, but he now sees them once every 2-3 years. He went last year, and he plans to follow-up again next year. Will continue to monitor. ? ? ?

## 2022-02-07 NOTE — Assessment & Plan Note (Signed)
Patient says he is being followed by rheumatology for this. His nose was impacted by sarcoidosis but previous scans and studies were negative for chest involvement or systemic disease. He was seeing them once yearly, but he now sees them once every 2-3 years. He went last year, and he plans to follow-up again next year. Will continue to monitor. ?

## 2022-02-07 NOTE — Patient Instructions (Signed)
It was very nice to see you in clinic today. Here are a few reminders from what we discussed: ? ?We will order omeprazole for you to continue taking as needed. ?Congratulations on graduating! If there is anything we can do to help support you as you enter the next stage of life, please let us know! ? ?We recommend you follow-up in 1 year for another physical or as needed. If you have any questions or concerns before your next appointment, feel free to let us know. ?

## 2022-02-07 NOTE — Assessment & Plan Note (Addendum)
Patient has a history of GERD. Daniel [name] he has recently eaten more spicy foods than normal as he finished his semester in school and prepared to graduate. He has taken omeprazole for reflux in the past as needed for 10-day courses but had not needed the medication for more than 3 years. He says the abdominal discomfort he feels currently and soreness of his chest on the L side were presenting symptoms for GERD previously. He says he initially felt the pain in his LUQ, but currently, his chest hurts more than his abdomen currently. He denies any nausea, vomiting, recent fevers or illnesses, trauma. On exam, bowel sounds were normal, but he was tender to palpation down the midline. No other abnormalities. Symptoms consistent with GERD. Patient says he ran out of omeprazole recently, so ordered more today. ? ?Plan ?- Continue omeprazole 40mg  as needed ?

## 2022-02-08 ENCOUNTER — Encounter: Payer: Self-pay | Admitting: Internal Medicine

## 2022-02-08 NOTE — Progress Notes (Signed)
Internal Medicine Clinic Attending  Case discussed with Dr. Christian  At the time of the visit.  We reviewed the resident's history and exam and pertinent patient test results.  I agree with the assessment, diagnosis, and plan of care documented in the resident's note.  

## 2022-05-18 ENCOUNTER — Encounter: Payer: Self-pay | Admitting: Family Medicine

## 2022-05-18 ENCOUNTER — Ambulatory Visit (INDEPENDENT_AMBULATORY_CARE_PROVIDER_SITE_OTHER): Payer: Medicaid Other | Admitting: Family Medicine

## 2022-05-18 VITALS — BP 100/66 | HR 66 | Temp 98.0°F | Ht 64.0 in | Wt 98.4 lb

## 2022-05-18 DIAGNOSIS — D863 Sarcoidosis of skin: Secondary | ICD-10-CM

## 2022-05-18 DIAGNOSIS — E46 Unspecified protein-calorie malnutrition: Secondary | ICD-10-CM | POA: Diagnosis not present

## 2022-05-18 DIAGNOSIS — K219 Gastro-esophageal reflux disease without esophagitis: Secondary | ICD-10-CM

## 2022-05-18 DIAGNOSIS — Z8669 Personal history of other diseases of the nervous system and sense organs: Secondary | ICD-10-CM

## 2022-05-18 DIAGNOSIS — Z87820 Personal history of traumatic brain injury: Secondary | ICD-10-CM | POA: Insufficient documentation

## 2022-05-18 NOTE — Assessment & Plan Note (Signed)
Some improvement with heartburn with omeprazole, however reflux seems to be leading to food intolerance, concerned there may be underlying motility disorder as well Continue omeprazole 40 mg Labs as listed below in 1 week fasting Likely need referral to GI

## 2022-05-18 NOTE — Progress Notes (Signed)
Assessment/Plan:  Spent 40 minutes reviewing patient chart and discussing history. At today's visit, we discussed treatment options, associated risk and benefits, and engage in counseling as needed.  Additionally the following were reviewed: Past medical records, past medical and surgical history, family and social background, as well as relevant laboratory results, imaging findings, and specialty notes, where applicable.  This message was generated using dictation software, and as a result, it may contain unintentional typos or errors.  Nevertheless, extensive effort was made to accurately convey at the pertinent aspects of the patient visit.    There may have been are other unrelated non-urgent complaints, but due to the busy schedule and the amount of time already spent with him, time does not permit to address these issues at today's visit. Another appointment may have or has been requested to review these additional issues.  Problem List Items Addressed This Visit       Digestive   Gastroesophageal reflux disease without esophagitis    Some improvement with heartburn with omeprazole, however reflux seems to be leading to food intolerance, concerned there may be underlying motility disorder as well Continue omeprazole 40 mg Labs as listed below in 1 week fasting Likely need referral to GI        Musculoskeletal and Integument   Cutaneous sarcoidosis    Symptomatically stable, however underlying inflammation may be leading to malnutrition and low BMI Recommend check inflammatory labs when patient returns        Other   History of concussion    Associated with headaches These seem to have resolved Continue monitor      History of migraine   Malnutrition ([place]) - Primary    Associated with low BMI 16, I suspect patient has some nutritional deficiencies associated with food intolerance with GERD Also has a history of sarcoidosis, where chronic inflammation could lead to higher  metabolic expenditure, this seems less likely given no active flares on skin or other symptoms associated with sarcoidosis Patient not fasting today Recommend patient return in 1 week for metabolic screening including CMP, lipid, hemoglobin A1c, CRP, ESR, UA, CBC, likely needs also screening for vitamin deficiency D, B12, iron studies Recommend patient use meal supplements including similar to ensure We will likely need referral back to GI          Subjective:  HPI:  Daniel [name] is a 21 y.o. male who has Cutaneous sarcoidosis; Gastroesophageal reflux disease without esophagitis; History of concussion; History of migraine; and Malnutrition ([place]) on their problem list..   He  has a past medical history of Abdominal pain, recurrent, GERD (gastroesophageal reflux disease), Premature birth, Sarcoidosis, and Vomiting.Marland Kitchen   He presents with chief complaint of Establish Care (Would like to discuss ways to gain weight.  Patient states that after he eats he feels like his food is coming back up. Patient would like to discuss gray hair as well. ) .   GERD, some nausea every few months improved with vomit, improved with omeprazole,   GERD: Patient complains of dyspepsia, GERD symptoms of prolonged duration, epigastric pain, nausea, and vomiting . Symptoms have been present for approximately several years. Symptoms include dysphagia, early satiety, fullness after meals, heartburn, nausea, regurgitation of undigested food, symptoms primarily relate to meals, and lying down after meals, and difficulty gaining weight . The patient denies chest pain, difficulty swallowing, hematemesis, hoarseness, melena, odynophagia, and shortness of breath. Symptoms appear to be worsened by bending, large meals, and lying down after eating.  Risk factors present for GERD include none. Risk factors absent for GERD are none. Studies performed so far include upper GI, result: positive for gastritis, colonoscopy, result:  negative. Treatments tried so far include lifestyle change including caffeine reduction, dietary changes, proton pump inhibitor: omeprazole. Results of treatment: some improvement in symptom severity. Currently, the symptoms are off and on and occur approximately a few times per weeks.  Patient has seen GI in the past as an adult, most recently a few years ago.  Patient also had work-up for low BMI.  He has tried to increase his weight by taking workout supplements, weight lifting, eating higher calorie meals.  Work-up in 2012 for malabsorptive issues showed negative celiac disease panel, negative ESR, negative CRP, negative LFTs, negative UA, normal CBC, normal electrolytes.  Patient does have a history of cutaneous sarcoidosis.  Year initially it flares around the lateral aspects of his nose.  These were irritating rash.  He had biopsy by dermatology.  He has since followed with rheumatology.  Last seen 2020.  ESR and CRP were negative.  Patient also had a negative chest x-ray at that time.  He reports no recent flares, chest pain, shortness of breath, diarrhea, constipation.   Patient reports that he has history of concussions and had migraines for a while after this.  Has not had one in several years.  Report that it was improved when he started getting her treatment. Past Surgical History:  Procedure Laterality Date   WISDOM TOOTH EXTRACTION      Outpatient Medications Prior to Visit  Medication Sig Dispense Refill   omeprazole (PRILOSEC) 40 MG capsule Take 1 capsule (40 mg total) by mouth daily. 90 capsule 1   No facility-administered medications prior to visit.    Family History  Problem Relation Age of Onset   Hyperlipidemia Father    Hypertension Maternal Grandmother    Sarcoidosis Maternal Grandmother    Diabetes Maternal Grandmother    Hyperlipidemia Maternal Grandmother    COPD Maternal Grandmother    Prostate cancer Maternal Grandfather    Neuropathy Maternal Grandfather     GER disease Paternal Grandmother    Hypertension Paternal Grandmother    Hyperlipidemia Paternal Grandmother    Thyroid disease Paternal Grandmother    Heart attack Paternal Grandfather    Colon cancer Neg Hx    Esophageal cancer Neg Hx    Rectal cancer Neg Hx    Stomach cancer Neg Hx     Social History   Socioeconomic History   Marital status: Single    Spouse name: Not on file   Number of children: Not on file   Years of education: Not on file   Highest education level: Not on file  Occupational History   Not on file  Tobacco Use   Smoking status: Never    Passive exposure: Never   Smokeless tobacco: Never  Vaping Use   Vaping Use: Never used  Substance and Sexual Activity   Alcohol use: Never   Drug use: Never   Sexual activity: Never  Other Topics Concern   Not on file  Social History Narrative   Not on file   Social Determinants of Health   Financial Resource Strain: Not on file  Food Insecurity: Not on file  Transportation Needs: Not on file  Physical Activity: Not on file  Stress: Not on file  Social Connections: Not on file  Intimate Partner Violence: Not on file  Objective:  Physical Exam: BP 100/66 (BP Location: Left Arm, Patient Position: Sitting, Cuff Size: Large)   Pulse 66   Temp 98 F (36.7 C) (Temporal)   Ht 5' 4" (1.626 m)   Wt 98 lb 6.4 oz (44.6 kg)   SpO2 99%   BMI 16.89 kg/m    General: No acute distress. Awake and conversant.  Eyes: Normal conjunctiva, anicteric. Round symmetric pupils.  ENT: Hearing grossly intact. No nasal discharge.  Neck: Neck is supple. No masses or thyromegaly.  Respiratory: Respirations are non-labored. No auditory wheezing.  CTA B Skin: Warm. No rashes or ulcers.  Psych: Alert and oriented. Cooperative, Appropriate mood and affect, Normal judgment.  ABD: Mild epigastric tenderness, nondistended, no rebound or  guarding CV: No cyanosis or JVD, normal S1-S2, no murmurs rubs or gallops, regular rate and rhythm MSK: Normal ambulation. No clubbing, no lower extremity edema Neuro: Sensation and CN II-XII grossly normal.        Alesia Banda, MD, MS

## 2022-05-18 NOTE — Assessment & Plan Note (Signed)
Associated with low BMI 16, I suspect patient has some nutritional deficiencies associated with food intolerance with GERD Also has a history of sarcoidosis, where chronic inflammation could lead to higher metabolic expenditure, this seems less likely given no active flares on skin or other symptoms associated with sarcoidosis Patient not fasting today Recommend patient return in 1 week for metabolic screening including CMP, lipid, hemoglobin A1c, CRP, ESR, UA, CBC, likely needs also screening for vitamin deficiency D, B12, iron studies Recommend patient use meal supplements including similar to ensure We will likely need referral back to GI

## 2022-05-18 NOTE — Assessment & Plan Note (Signed)
Symptomatically stable, however underlying inflammation may be leading to malnutrition and low BMI Recommend check inflammatory labs when patient returns

## 2022-05-18 NOTE — Assessment & Plan Note (Signed)
Associated with headaches These seem to have resolved Continue monitor

## 2022-05-22 ENCOUNTER — Ambulatory Visit: Payer: Medicaid Other | Admitting: Family Medicine

## 2022-05-25 ENCOUNTER — Encounter: Payer: Self-pay | Admitting: Family Medicine

## 2022-05-25 ENCOUNTER — Ambulatory Visit (INDEPENDENT_AMBULATORY_CARE_PROVIDER_SITE_OTHER): Payer: Medicaid Other | Admitting: Family Medicine

## 2022-05-25 VITALS — BP 102/70 | HR 57 | Temp 97.4°F | Wt 99.6 lb

## 2022-05-25 DIAGNOSIS — Z114 Encounter for screening for human immunodeficiency virus [HIV]: Secondary | ICD-10-CM | POA: Diagnosis not present

## 2022-05-25 DIAGNOSIS — D863 Sarcoidosis of skin: Secondary | ICD-10-CM

## 2022-05-25 DIAGNOSIS — Z1159 Encounter for screening for other viral diseases: Secondary | ICD-10-CM

## 2022-05-25 DIAGNOSIS — E46 Unspecified protein-calorie malnutrition: Secondary | ICD-10-CM | POA: Diagnosis not present

## 2022-05-25 DIAGNOSIS — K219 Gastro-esophageal reflux disease without esophagitis: Secondary | ICD-10-CM | POA: Diagnosis not present

## 2022-05-25 DIAGNOSIS — Z13220 Encounter for screening for lipoid disorders: Secondary | ICD-10-CM

## 2022-05-25 DIAGNOSIS — Z0000 Encounter for general adult medical examination without abnormal findings: Secondary | ICD-10-CM

## 2022-05-25 LAB — LIPID PANEL
Cholesterol: 166 mg/dL (ref 0–200)
HDL: 54.5 mg/dL (ref >39.00)
LDL Cholesterol: 94 mg/dL (ref 0–99)
NonHDL: 111.16
Total CHOL/HDL Ratio: 3
Triglycerides: 87 mg/dL (ref 0.0–149.0)
VLDL: 17.4 mg/dL (ref 0.0–40.0)

## 2022-05-25 LAB — COMPREHENSIVE METABOLIC PANEL
ALT: 11 U/L (ref 0–53)
AST: 21 U/L (ref 0–37)
Albumin: 4.9 g/dL (ref 3.5–5.2)
Alkaline Phosphatase: 64 U/L (ref 39–117)
BUN: 9 mg/dL (ref 6–23)
CO2: 25 mEq/L (ref 19–32)
Calcium: 9.6 mg/dL (ref 8.4–10.5)
Chloride: 101 mEq/L (ref 96–112)
Creatinine, Ser: 0.87 mg/dL (ref 0.40–1.50)
GFR: 122.66 mL/min (ref >60.00)
Glucose, Bld: 83 mg/dL (ref 70–99)
Potassium: 4 mEq/L (ref 3.5–5.1)
Sodium: 139 mEq/L (ref 135–145)
Total Bilirubin: 1.1 mg/dL (ref 0.2–1.2)
Total Protein: 7.4 g/dL (ref 6.0–8.3)

## 2022-05-25 LAB — CBC
HCT: 44.5 % (ref 39.0–52.0)
Hemoglobin: 15.8 g/dL (ref 13.0–17.0)
MCHC: 35.4 g/dL (ref 30.0–36.0)
MCV: 87.9 fl (ref 78.0–100.0)
Platelets: 199 10*3/uL (ref 150.0–400.0)
RBC: 5.06 Mil/uL (ref 4.22–5.81)
RDW: 13.2 % (ref 11.5–15.5)
WBC: 4.3 10*3/uL (ref 4.0–10.5)

## 2022-05-25 LAB — HEMOGLOBIN A1C: Hgb A1c MFr Bld: 5 % (ref 4.6–6.5)

## 2022-05-25 LAB — VITAMIN B12: Vitamin B-12: 1350 pg/mL — ABNORMAL HIGH (ref 211–911)

## 2022-05-25 LAB — C-REACTIVE PROTEIN: CRP: <1 mg/dL (ref 0.5–20.0)

## 2022-05-25 LAB — SEDIMENTATION RATE: Sed Rate: 2 mm/hr (ref 0–15)

## 2022-05-25 MED ORDER — OMEPRAZOLE 40 MG PO CPDR: 40.0000 mg | DELAYED_RELEASE_CAPSULE | Freq: Every day | ORAL | 1 refills | Status: DC

## 2022-05-25 NOTE — Assessment & Plan Note (Signed)
Stable, check esr/crp

## 2022-05-25 NOTE — Assessment & Plan Note (Addendum)
Extensive work-up by GI since 2012, has followed up with them last seen around 2021 Endoscopy showed some esophagitis and gastritis, H. pylori was negative, tissue biopsies did not result in any dysplasia He has also had a gastric emptying study that was within normal limits Patient had negative lab work-up including IBD and celiac disease Continue omeprazole 40 mg Likely leading to malnutrition through poor absorbtion?

## 2022-05-25 NOTE — Progress Notes (Signed)
Chief Complaint:  Daniel [name] is a 21 y.o. male who presents today for his annual comprehensive physical exam.    Assessment/Plan:   Problem List Items Addressed This Visit       Digestive   Gastroesophageal reflux disease without esophagitis    Extensive work-up by GI since 2012, has followed up with them last seen around 2021 Endoscopy showed some esophagitis and gastritis, H. pylori was negative, tissue biopsies did not result in any dysplasia He has also had a gastric emptying study that was within normal limits Patient had negative lab work-up including IBD and celiac disease Continue omeprazole 40 mg Likely leading to malnutrition through poor absorbtion?       Relevant Medications   omeprazole (PRILOSEC) 40 MG capsule   Other Relevant Orders   Comp Met (CMET) (Completed)   B12 (Completed)     Musculoskeletal and Integument   Cutaneous sarcoidosis    Stable, check esr/crp      Relevant Orders   Sedimentation rate (Completed)   C-reactive protein (Completed)     Other   Malnutrition ([facility]CC)   Relevant Orders   Comp Met (CMET) (Completed)   B12 (Completed)   Hemoglobin A1C (Completed)   CBC (Completed)   Vitamin D 1,25 dihydroxy   Other Visit Diagnoses     Annual physical exam    -  Primary   Screening for HIV (human immunodeficiency virus)       Relevant Orders   HIV antibody (with reflex)   Encounter for hepatitis C screening test for low risk patient       Relevant Orders   Hepatitis C Antibody   Screening, lipid       Relevant Orders   Lipid Profile (Completed)        Patient Counseling(The following topics were reviewed and/or handout was given):  -Nutrition: Stressed importance of moderation in sodium/caffeine intake, saturated fat and cholesterol, caloric balance, sufficient intake of fresh fruits, vegetables, and fiber.  -Stressed the importance of regular exercise.   -Substance Abuse: Discussed cessation/primary prevention of tobacco,  alcohol, or other drug use; driving or other dangerous activities under the influence; availability of treatment for abuse.   -Injury prevention: Discussed safety belts, safety helmets, smoke detector, smoking near bedding or upholstery.   -Sexuality: Discussed sexually transmitted diseases, partner selection, use of condoms, avoidance of unintended pregnancy and contraceptive alternatives.   -Dental health: Discussed importance of regular tooth brushing, flossing, and dental visits.  -Health maintenance and immunizations reviewed. Please refer to Health maintenance section.  Return to care in 1 year for next preventative visit.     Subjective:  HPI:    Patient reports that he has concern about tremor hands.  He reports that the duration has been ongoing for "while" possibly several months but is unsure.  He says that it is intermittant and that it occurs once every two months.  It typically last for 1-2 mintutes. he tends to noticed it when grasping something such as taking a drink out of the refrigerator.  He says that it is not getting more frequent or and that the duration is not longer.  He reports that his grandma other and grandfather have tremors.  He also endorses getting leg numbness when in sitting with legs crossed floor legs.  He describes his legs as legs "falling asleep" but that they get better when getting up.  He denies any other no other numbness or weakness or change in sensation.  Patient has a history of GERD.  Is stable on omeprazole.  Patient is a history of cutaneous sarcoidosis.  He has no symptoms of skin changes or rash.  Denies fevers or chills.  Lifestyle Diet: Attempts to eat regularly, healthy, but with protein supplements, but this is limited due to GERD and dyspepsia  exercise: Works out regularly     05/18/2022    2:24 PM  Depression screen PHQ 2/9  Decreased Interest 0  Down, Depressed, Hopeless 0  PHQ - 2 Score 0  Altered sleeping 0  Tired,  decreased energy 0  Change in appetite 0  Feeling bad or failure about yourself  0  Trouble concentrating 0  Moving slowly or fidgety/restless 0  Suicidal thoughts 0  PHQ-9 Score 0  Difficult doing work/chores Not difficult at all    Health Maintenance Due  Topic Date Due   Hepatitis C Screening  Never done      [facility] Weights   05/25/22 0845  Weight: 99 lb 9.6 oz (45.2 kg)     ROS: As per HPI, otherwise all systems reviewed and are negative  PMH:  The following were reviewed and entered/updated in epic: Past Medical History:  Diagnosis Date   Abdominal pain, recurrent    GERD (gastroesophageal reflux disease)    Premature birth    Sarcoidosis    Subcutaneous   Vomiting    Patient Active Problem List   Diagnosis Date Noted   History of concussion 05/18/2022   History of migraine 05/18/2022   Malnutrition ([place]) 05/18/2022   Gastroesophageal reflux disease without esophagitis 04/28/2019   Cutaneous sarcoidosis 10/14/2014   Past Surgical History:  Procedure Laterality Date   WISDOM TOOTH EXTRACTION      Family History  Problem Relation Age of Onset   Hyperlipidemia Father    Hypertension Maternal Grandmother    Sarcoidosis Maternal Grandmother    Diabetes Maternal Grandmother    Hyperlipidemia Maternal Grandmother    COPD Maternal Grandmother    Prostate cancer Maternal Grandfather    Neuropathy Maternal Grandfather    GER disease Paternal Grandmother    Hypertension Paternal Grandmother    Hyperlipidemia Paternal Grandmother    Thyroid disease Paternal Grandmother    Heart attack Paternal Grandfather    Colon cancer Neg Hx    Esophageal cancer Neg Hx    Rectal cancer Neg Hx    Stomach cancer Neg Hx     Medications- reviewed and updated Current Outpatient Medications  Medication Sig Dispense Refill   omeprazole (PRILOSEC) 40 MG capsule Take 1 capsule (40 mg total) by mouth daily. 90 capsule 1   No current facility-administered medications for  this visit.    Allergies-reviewed and updated No Known Allergies  Social History   Socioeconomic History   Marital status: Single    Spouse name: Not on file   Number of children: Not on file   Years of education: Not on file   Highest education level: Not on file  Occupational History   Not on file  Tobacco Use   Smoking status: Never    Passive exposure: Never   Smokeless tobacco: Never  Vaping Use   Vaping Use: Never used  Substance and Sexual Activity   Alcohol use: Never   Drug use: Never   Sexual activity: Never  Other Topics Concern   Not on file  Social History Narrative   Not on file   Social Determinants of Health   Financial Resource Strain: Not on file  Food Insecurity: Not on file  Transportation Needs: Not on file  Physical Activity: Not on file  Stress: Not on file  Social Connections: Not on file        Objective:  Physical Exam: BP 102/70 (BP Location: Left Arm, Patient Position: Sitting, Cuff Size: Large)   Pulse (!) 57   Temp (!) 97.4 F (36.3 C) (Temporal)   Wt 99 lb 9.6 oz (45.2 kg)   SpO2 99%   BMI 17.10 kg/m   Body mass index is 17.1 kg/m. Wt Readings from Last 3 Encounters:  05/25/22 99 lb 9.6 oz (45.2 kg)  05/18/22 98 lb 6.4 oz (44.6 kg)  02/07/22 96 lb 6.4 oz (43.7 kg)    Gen: NAD, resting comfortably CV: RRR with no murmurs appreciated Pulm: NWOB, CTAB with no crackles, wheezes, or rhonchi GI: Normal bowel sounds present. Soft, mild epigastric, Nondistended. MSK: no edema, cyanosis, or clubbing noted Skin: warm, dry Neuro: grossly normal, moves all extremities Psych: Normal affect and thought content      At today's visit, we discussed treatment options, associated risk and benefits, and engage in counseling as needed.  Additionally the following were reviewed: Past medical records, past medical and surgical history, family and social background, as well as relevant laboratory results, imaging findings, and specialty  notes, where applicable.  This message was generated using dictation software, and as a result, it may contain unintentional typos or errors.  Nevertheless, extensive effort was made to accurately convey at the pertinent aspects of the patient visit.    There may have been are other unrelated non-urgent complaints, but due to the busy schedule and the amount of time already spent with him, time does not permit to address these issues at today's visit. Another appointment may have or has been requested to review these additional issues.   Marny Lowenstein, MD, MS

## 2022-05-28 LAB — HIV ANTIBODY (ROUTINE TESTING W REFLEX): HIV 1&2 Ab, 4th Generation: NONREACTIVE

## 2022-05-28 LAB — VITAMIN D 1,25 DIHYDROXY
Vitamin D 1, 25 (OH)2 Total: 39 pg/mL (ref 18–72)
Vitamin D2 1, 25 (OH)2: <8 pg/mL
Vitamin D3 1, 25 (OH)2: 39 pg/mL

## 2022-05-28 LAB — HEPATITIS C ANTIBODY: Hepatitis C Ab: NONREACTIVE

## 2022-08-15 ENCOUNTER — Ambulatory Visit (INDEPENDENT_AMBULATORY_CARE_PROVIDER_SITE_OTHER): Payer: Medicaid Other | Admitting: Family Medicine

## 2022-08-15 ENCOUNTER — Encounter: Payer: Self-pay | Admitting: Family Medicine

## 2022-08-15 ENCOUNTER — Ambulatory Visit (INDEPENDENT_AMBULATORY_CARE_PROVIDER_SITE_OTHER): Payer: Medicaid Other

## 2022-08-15 VITALS — BP 114/78 | HR 72 | Temp 97.8°F | Wt 97.6 lb

## 2022-08-15 DIAGNOSIS — D863 Sarcoidosis of skin: Secondary | ICD-10-CM

## 2022-08-15 DIAGNOSIS — K5900 Constipation, unspecified: Secondary | ICD-10-CM | POA: Diagnosis not present

## 2022-08-15 MED ORDER — LINACLOTIDE 72 MCG PO CAPS: 72.0000 ug | ORAL_CAPSULE | Freq: Every day | ORAL | 0 refills | Status: DC

## 2022-08-15 MED ORDER — DICYCLOMINE HCL 10 MG PO CAPS: 10.0000 mg | ORAL_CAPSULE | Freq: Three times a day (TID) | ORAL | 0 refills | Status: DC | PRN

## 2022-08-15 NOTE — Progress Notes (Signed)
Assessment/Plan:   Problem List Items Addressed This Visit       Musculoskeletal and Integument   Cutaneous sarcoidosis    We will get chest x-ray Refer to rheumatology      Relevant Orders   DG Chest 2 View   Ambulatory referral to Rheumatology     Other   Constipation - Primary    Associated with chronic abdominal pain.  He has had GI work-up in the past and GERD with suspected.  Patient symptoms are more consistent with possible IBS Given constipation is predominant symptom we will start Linzess, Bentyl for intermittent abdominal crampiness and refer to GI for further assessment      Relevant Medications   linaclotide (LINZESS) 72 MCG capsule   dicyclomine (BENTYL) 10 MG capsule   Other Relevant Orders   Ambulatory referral to Gastroenterology       Subjective:  HPI:  Daniel [name] is a 21 y.o. male who has Cutaneous sarcoidosis; Gastroesophageal reflux disease without esophagitis; History of concussion; History of migraine; Malnutrition ([facility]); and Constipation on their problem list..   He  has a past medical history of Abdominal pain, recurrent, GERD (gastroesophageal reflux disease), Premature birth, Sarcoidosis, and Vomiting.Marland Kitchen   He presents with chief complaint of Abdominal Pain (Constipation and diarrhea off and on. Lower Abdominal pain x 2-3 weeks) .   Irritable Bowel Syndrome Patient complains of abdominal cramping and constipation . Onset of symptoms was several weeks ago. Current symptoms: crampy abdominal pain: moderate: location: in the lower abdomen and constipation alternating with loose stools. Patient denies arthralagias, belching, chills, dysuria, fever, flatus, frequency, headache, hematochezia, hematuria, melena, myalgias, nausea, sweats, and vomiting. Symptoms have waxed and waned. Previous visits for this problem: none. Evaluation to date has been EGD: abnormal with gastritis and colonsopies . Treatment to date has been  miralax without improvement  .  Cutaneous sarcoidosis.  Patient with history of cutaneous sarcoidosis.  He has followed with dermatology and rheumatology in the past.  He has no current skin lesions or rashes.  He denies any trouble breathing.  Past Surgical History:  Procedure Laterality Date   WISDOM TOOTH EXTRACTION      Outpatient Medications Prior to Visit  Medication Sig Dispense Refill   omeprazole (PRILOSEC) 40 MG capsule Take 1 capsule (40 mg total) by mouth daily. 90 capsule 1   Probiotic Product (PROBIOTIC DAILY PO) Take by mouth.     No facility-administered medications prior to visit.    Family History  Problem Relation Age of Onset   Hyperlipidemia Father    Hypertension Maternal Grandmother    Sarcoidosis Maternal Grandmother    Diabetes Maternal Grandmother    Hyperlipidemia Maternal Grandmother    COPD Maternal Grandmother    Prostate cancer Maternal Grandfather    Neuropathy Maternal Grandfather    GER disease Paternal Grandmother    Hypertension Paternal Grandmother    Hyperlipidemia Paternal Grandmother    Thyroid disease Paternal Grandmother    Heart attack Paternal Grandfather    Colon cancer Neg Hx    Esophageal cancer Neg Hx    Rectal cancer Neg Hx    Stomach cancer Neg Hx     Social History   Socioeconomic History   Marital status: Single    Spouse name: Not on file   Number of children: Not on file   Years of education: Not on file   Highest education level: Not on file  Occupational History   Not on file  Tobacco  Use   Smoking status: Never    Passive exposure: Never   Smokeless tobacco: Never  Vaping Use   Vaping Use: Never used  Substance and Sexual Activity   Alcohol use: Never   Drug use: Never   Sexual activity: Never  Other Topics Concern   Not on file  Social History Narrative   Not on file   Social Determinants of Health   Financial Resource Strain: Not on file  Food Insecurity: Not on file  Transportation Needs: Not on file  Physical  Activity: Not on file  Stress: Not on file  Social Connections: Not on file  Intimate Partner Violence: Not on file                                                                                                 Objective:  Physical Exam: BP 114/78 (BP Location: Left Arm, Patient Position: Sitting, Cuff Size: Large)   Pulse 72   Temp 97.8 F (36.6 C) (Temporal)   Wt 97 lb 9.6 oz (44.3 kg)   SpO2 98%   BMI 16.75 kg/m    General: No acute distress. Awake and conversant.  Eyes: Normal conjunctiva, anicteric. Round symmetric pupils.  ENT: Hearing grossly intact. No nasal discharge.  Neck: Neck is supple. No masses or thyromegaly.  Respiratory: Respirations are non-labored. No auditory wheezing.  Skin: Warm. No rashes or ulcers.  Psych: Alert and oriented. Cooperative, Appropriate mood and affect, Normal judgment.  CV: No cyanosis or JVD MSK: Normal ambulation. No clubbing  ABD: mild lower abdominal tenderness, no rebound or guarding Neuro: Sensation and CN II-XII grossly normal.        Alesia Banda, MD, MS

## 2022-08-15 NOTE — Assessment & Plan Note (Signed)
We will get chest x-ray Refer to rheumatology

## 2022-08-15 NOTE — Assessment & Plan Note (Signed)
Associated with chronic abdominal pain.  He has had GI work-up in the past and GERD with suspected.  Patient symptoms are more consistent with possible IBS Given constipation is predominant symptom we will start Linzess, Bentyl for intermittent abdominal crampiness and refer to GI for further assessment

## 2022-08-15 NOTE — Progress Notes (Unsigned)
Initial visit for abdominal pain. Hx of cutaneous sarcoidosis. Marland Kitchen

## 2022-08-15 NOTE — Patient Instructions (Signed)
For constipation, try linzess for constipation. May try bentyl for additional abdominal cramping.

## 2022-09-06 ENCOUNTER — Other Ambulatory Visit: Payer: Self-pay | Admitting: Family Medicine

## 2022-09-06 DIAGNOSIS — K5900 Constipation, unspecified: Secondary | ICD-10-CM

## 2022-09-11 ENCOUNTER — Encounter: Payer: Self-pay | Admitting: Gastroenterology

## 2022-09-29 ENCOUNTER — Ambulatory Visit: Payer: Medicaid Other | Admitting: Internal Medicine

## 2022-10-11 NOTE — Progress Notes (Signed)
Office Visit Note  Patient: Daniel [name]             Date of Birth: August 08, 2001           MRN: [id number]             PCP: Bonnita Hollow, MD Referring: Bonnita Hollow, MD Visit Date: 10/12/2022  Subjective:  Sarcoidosis and Joint Pain   History of Present Illness: Daniel [name] is a 22 y.o. 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 He was diagnosed age 61 with skin biopsy for facial lesion with rash along the borders of his nose on both sides. Initial treatment with topical steroids for these rashes. He was never on long term DMARD treatment for this condition and no known systemic disease. No recent skin problems besides residual hyperpigmented spots and he also notices few white discolored spots on his torso and arms. He has eye pain and headaches intermittent most often  on the left side with no inflammatory findings noted with his eye exam including slit lamp evaluation. He does not complain of significant dryness. He does have some seasonal or environmental allergies with rhinitis but takes medication only as needed for symptoms. He previously saw rheumatology with monitoring including serum inflammatory markers and chest xray normal monitoring from 2016 in pediatric rheumatology clinic then later with [facility]. Most recent checked in November with PCP office. He is active playing sports without difficulty but occasionally feels chest tightness at other times such as vacuuming his home. This is not associated with chest pain or cough. He gets joint pain intermittently most often at his wrists and knees. Pain is usually after increased physical activity such as playing basketball. There has been some knee swelling in the past but usually pain is not associated with visible changes. He did have a fall with rib and medial knee pain in the past but no fracture and no surgical intervention required. His other active problem is chronic GI issue with constipation predominant IBS  and GERD with unintentional weight loss and difficulty gaining weight. He had GI evaluation for this including a normal colonoscopy. Linzess has been very help for IBS symptoms. He denies any trouble with swallowing or choking on food or liquids.   Activities of Daily Living:  Patient reports morning stiffness for 0 minutes.   Patient Reports nocturnal pain.  Difficulty dressing/grooming: Denies Difficulty climbing stairs: Denies Difficulty getting out of chair: Denies Difficulty using hands for taps, buttons, cutlery, and/or writing: Denies  Review of Systems  Constitutional:  Negative for fatigue.  HENT:  Positive for mouth sores. Negative for mouth dryness.   Eyes:  Negative for dryness.  Respiratory:  Negative for shortness of breath.   Cardiovascular:  Positive for chest pain. Negative for palpitations.  Gastrointestinal:  Positive for constipation and diarrhea. Negative for blood in stool.  Endocrine: Negative for increased urination.  Genitourinary:  Negative for involuntary urination.  Musculoskeletal:  Positive for joint pain, joint pain, myalgias and myalgias. Negative for gait problem, joint swelling, muscle weakness, morning stiffness and muscle tenderness.  Skin:  Positive for hair loss. Negative for color change, rash and sensitivity to sunlight.  Allergic/Immunologic: Negative for susceptible to infections.  Neurological:  Positive for dizziness and headaches.  Hematological:  Negative for swollen glands.  Psychiatric/Behavioral:  Negative for depressed mood and sleep disturbance. The patient is not nervous/anxious.     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     Past Medical History:  Diagnosis Date   Abdominal pain, recurrent    GERD (gastroesophageal reflux disease)    IBS (irritable bowel syndrome)    Premature birth    Sarcoidosis    Subcutaneous   Vomiting     Family History  Problem Relation Age of Onset   Hyperlipidemia Father    Hypertension Maternal Grandmother    Sarcoidosis Maternal Grandmother    Diabetes Maternal Grandmother    Hyperlipidemia Maternal Grandmother    COPD Maternal Grandmother    Prostate cancer Maternal Grandfather    Neuropathy Maternal Grandfather    GER disease Paternal Grandmother    Hypertension Paternal Grandmother    Hyperlipidemia Paternal Grandmother    Thyroid disease Paternal Grandmother    Heart attack Paternal Grandfather    Colon cancer Neg Hx    Esophageal cancer Neg Hx    Rectal cancer Neg Hx    Stomach cancer Neg Hx    Past Surgical History:  Procedure Laterality Date   WISDOM TOOTH EXTRACTION     Social History   Social History Narrative   Not on file   Immunization History  Administered Date(s) Administered   DTaP 04/10/2000, 06/12/2000, 08/09/2000, 05/29/2001, 07/05/2004   HIB (PRP-OMP) 04/10/2000, 06/12/2000, 08/09/2000, 05/29/2001   HPV 9-valent 02/13/2011, 02/15/2012, 02/14/2013   Hepatitis A 02/25/2009, 02/08/2010   Hepatitis B 04/10/2000, 06/12/2000, 11/06/2000   IPV 04/10/2000, 06/12/2000, 11/06/2000, 07/05/2004   MMR 02/06/2001, 07/05/2004   MenQuadfi_Meningococcal Groups ACYW Conjugate 02/13/2011, 02/08/2017   Meningococcal B, OMV 02/08/2017, 02/12/2018   PFIZER(Purple Top)SARS-COV-2 Vaccination 04/12/2020, 05/04/2020   Pneumococcal Conjugate PCV 7 08/09/2000, 11/06/2000, 02/06/2001   Td 02/13/2011   Tdap 02/13/2011   Varicella 02/06/2001, 07/04/2005     Objective: Vital Signs: BP 121/82 (BP Location: Right Arm, Patient Position: Sitting, Cuff Size: Small)   Pulse 67   Resp 12   Ht 5' 4.5" (1.638 m)   Wt 97 lb 12.8 oz (44.4 kg)   BMI 16.53 kg/m     Physical Exam Constitutional:      Comments: Underweight  HENT:     Mouth/Throat:     Mouth: Mucous membranes are moist.     Pharynx: Oropharynx is clear.  Eyes:     Conjunctiva/sclera: Conjunctivae normal.  Cardiovascular:     Rate and Rhythm: Normal rate and regular rhythm.  Pulmonary:     Effort: Pulmonary effort is normal.     Breath sounds: Normal breath sounds.  Lymphadenopathy:     Cervical: No cervical adenopathy.  Skin:    General: Skin is warm and dry.     Comments: Small <3mm diameter hypopigmented spots on chest and arm  Neurological:     Mental Status: He is alert.  Psychiatric:        Mood and Affect: Mood normal.      Musculoskeletal Exam:  Shoulders full ROM no tenderness or swelling Elbows full ROM no tenderness or swelling Wrists full ROM no tenderness or swelling Fingers full ROM no tenderness or swelling Knees full ROM no tenderness or swelling, click or crepitus in anterior knee, negative patellar grind test Ankles full ROM no tenderness or swelling   Investigation: No additional findings.  Imaging: No results found.  Recent Labs: Lab  Results  Component Value Date   WBC 4.3 05/25/2022   HGB 15.8 05/25/2022   PLT 199.0 05/25/2022   NA 139 05/25/2022   K 4.0 05/25/2022   CL 101 05/25/2022   CO2 25 05/25/2022   GLUCOSE 83 05/25/2022   BUN 9 05/25/2022   CREATININE 0.87 05/25/2022   BILITOT 1.1 05/25/2022   ALKPHOS 64 05/25/2022   AST 21 05/25/2022   ALT 11 05/25/2022   PROT 7.4 05/25/2022   ALBUMIN 4.9 05/25/2022   CALCIUM 9.6 05/25/2022    Speciality Comments: No specialty comments available.  Procedures:  No procedures performed Allergies: Patient has no known allergies.   Assessment / Plan:     Visit Diagnoses: Cutaneous sarcoidosis - Plan: Sedimentation rate, C-reactive protein, Angiotensin converting enzyme  I do not see any signs of active skin or systemic disease currently. I believe his intermittent joint pains are  more use or mechanical in nature rather than inflammatory. He had recent xray images for screening reassuring in November. Plan to recheck serum inflammatory markers as well as ACE level today but lower pretest suspicion for systemic disease.  Malnutrition, unspecified type ([facility]) Gastroesophageal reflux disease without esophagitis Constipation, unspecified constipation type  Discussed this is his biggest current concern but I do not see any evidence so far to suggest inflammatory disease underlying his GI symptoms or unintentional weight loss. I don't see any particular indication for additional abdominal imaging for rheumatology concerns.  Agree with continuing GI follow up and IBS treatments.  Patellofemoral arthralgia of both knees  Pain seems suggestive for PFPS with some crepitus in the front of the knee but no grind test or particular instability on exam. He is somewhat underweight although not particularly deconditioned which could be contributing. Provided some information to review on this including NSAIDs, proximal leg strengthening, knee bracing, or limited provocative physical activities as needed.  Orders: Orders Placed This Encounter  Procedures   Sedimentation rate   C-reactive protein   Angiotensin converting enzyme   No orders of the defined types were placed in this encounter.    Follow-Up Instructions: Return in about 1 year (around 10/13/2023) for Sarcoid obs f/u 29yr or PRN.   Collier Salina, MD  Note - This record has been created using [employer].  Chart creation errors have been sought, but may not always  have been located. Such creation errors do not reflect on  the standard of medical care.

## 2022-10-12 ENCOUNTER — Encounter: Payer: Self-pay | Admitting: Internal Medicine

## 2022-10-12 ENCOUNTER — Ambulatory Visit: Payer: Medicaid Other | Attending: Internal Medicine | Admitting: Internal Medicine

## 2022-10-12 VITALS — BP 121/82 | HR 67 | Resp 12 | Ht 64.5 in | Wt 97.8 lb

## 2022-10-12 DIAGNOSIS — K219 Gastro-esophageal reflux disease without esophagitis: Secondary | ICD-10-CM | POA: Diagnosis not present

## 2022-10-12 DIAGNOSIS — E46 Unspecified protein-calorie malnutrition: Secondary | ICD-10-CM

## 2022-10-12 DIAGNOSIS — K5900 Constipation, unspecified: Secondary | ICD-10-CM

## 2022-10-12 DIAGNOSIS — M222X1 Patellofemoral disorders, right knee: Secondary | ICD-10-CM

## 2022-10-12 DIAGNOSIS — D863 Sarcoidosis of skin: Secondary | ICD-10-CM

## 2022-10-12 DIAGNOSIS — M222X2 Patellofemoral disorders, left knee: Secondary | ICD-10-CM

## 2022-10-13 LAB — SEDIMENTATION RATE: Sed Rate: 2 mm/h (ref 0–15)

## 2022-10-13 LAB — C-REACTIVE PROTEIN: CRP: <0.2 mg/L (ref <8.0)

## 2022-10-13 LAB — ANGIOTENSIN CONVERTING ENZYME: Angiotensin-Converting Enzyme: 58 U/L (ref 9–67)

## 2022-10-13 NOTE — Progress Notes (Signed)
Lab results show no evidence of systemic sarcoidosis at this time. His recent chest xray from November was also clear. Based on this I do not think he needs any new treatment for this problem we can follow up in a year for monitoring or if he sees new symptoms that need to be checked out.

## 2022-11-10 ENCOUNTER — Ambulatory Visit (INDEPENDENT_AMBULATORY_CARE_PROVIDER_SITE_OTHER): Payer: Medicaid Other | Admitting: Gastroenterology

## 2022-11-10 ENCOUNTER — Encounter: Payer: Self-pay | Admitting: Gastroenterology

## 2022-11-10 VITALS — BP 98/58 | HR 64 | Ht 64.0 in | Wt 95.0 lb

## 2022-11-10 DIAGNOSIS — K581 Irritable bowel syndrome with constipation: Secondary | ICD-10-CM | POA: Diagnosis not present

## 2022-11-10 DIAGNOSIS — R140 Abdominal distension (gaseous): Secondary | ICD-10-CM

## 2022-11-10 DIAGNOSIS — R109 Unspecified abdominal pain: Secondary | ICD-10-CM

## 2022-11-10 NOTE — Patient Instructions (Signed)
Dr Silverio Decamp recommends that you complete a bowel purge (to clean out your bowels). Please do the following: Purchase a bottle of Miralax over the counter as well as a box of 5 mg dulcolax tablets. Take 4 dulcolax tablets. Wait 1 hour. You will then drink 6-8 capfuls of Miralax mixed in an adequate amount of water/juice/gatorade (you may choose which of these liquids to drink) over the next 2-3 hours. You should expect results within 1 to 6 hours after completing the bowel purge.  Then start Benefiber 1 tablespoon twice daily with meals.  If you are still constipated then take Linzess 145 mcg daily samples and call our office so we can send a prescription.   Please follow up in 2-3 months.   The [facility] providers would like to encourage you to use [facility] to communicate with providers for non-urgent requests or questions.  Due to long hold times on the telephone, sending your provider a message by [facility] may be a faster and more efficient way to get a response.  Please allow 48 business hours for a response.  Please remember that this is for non-urgent requests.   Due to recent changes in healthcare laws, you may see the results of your imaging and laboratory studies on MyChart before your provider has had a chance to review them.  We understand that in some cases there may be results that are confusing or concerning to you. Not all laboratory results come back in the same time frame and the provider may be waiting for multiple results in order to interpret others.  Please give [place] 48 hours in order for your provider to thoroughly review all the results before contacting the office for clarification of your results.

## 2022-11-10 NOTE — Progress Notes (Signed)
Daniel [name]    RY:[id number]    [birth year]  Primary Care Physician:Bonnita Hollow, MD  Referring Physician: Bonnita Hollow, [place],  [place] [address]   Chief complaint: Constipation  HPI: 22 year old very pleasant male is here with complaints of change in bowel habits, constipation. He started having change in bowel habits with constipation last year in November, saw his PCP and was prescribed Linzess, initially he had improvement of bowel habits but after about 2 weeks it stopped working for him.  He had severe abdominal discomfort recommended to have a bowel movement for a week.  He continues to have intermittent abdominal bloating and discomfort, on average has a bowel movement every other day  Denies any rectal bleeding, melena or vomiting. His weight is stable.  CRP and ESR undetectable January 2024  EGD May 21, 2019: Mild gastritis otherwise normal.  Biopsies negative for H. pylori or celiac sprue   Colonoscopy May 21, 2019: Normal.  Normal TI and colon biopsies  Celiac panel 06/19/2011: Negative  Abdominal ultrasound 07/21/2011: Normal Outpatient Encounter Medications as of 11/10/2022  Medication Sig   dicyclomine (BENTYL) 10 MG capsule Take 1 capsule (10 mg total) by mouth 3 (three) times daily with meals as needed (abdominal cramps).   [DISCONTINUED] linaclotide (LINZESS) 72 MCG capsule Take 1 capsule (72 mcg total) by mouth daily before breakfast.   omeprazole (PRILOSEC) 40 MG capsule Take 1 capsule (40 mg total) by mouth daily. (Patient taking differently: Take 40 mg by mouth as needed.)   Probiotic Product (PROBIOTIC DAILY PO) Take by mouth.   No facility-administered encounter medications on file as of 11/10/2022.    Allergies as of 11/10/2022   (No Known Allergies)    Past Medical History:  Diagnosis Date   Abdominal pain, recurrent    GERD (gastroesophageal reflux disease)    IBS (irritable bowel  syndrome)    Premature birth    Sarcoidosis    Subcutaneous   Vomiting     Past Surgical History:  Procedure Laterality Date   WISDOM TOOTH EXTRACTION      Family History  Problem Relation Age of Onset   Hyperlipidemia Father    Hypertension Maternal Grandmother    Sarcoidosis Maternal Grandmother    Diabetes Maternal Grandmother    Hyperlipidemia Maternal Grandmother    COPD Maternal Grandmother    Prostate cancer Maternal Grandfather    Neuropathy Maternal Grandfather    GER disease Paternal Grandmother    Hypertension Paternal Grandmother    Hyperlipidemia Paternal Grandmother    Thyroid disease Paternal Grandmother    Heart attack Paternal Grandfather    Colon cancer Neg Hx    Esophageal cancer Neg Hx    Rectal cancer Neg Hx    Stomach cancer Neg Hx     Social History   Socioeconomic History   Marital status: Single    Spouse name: Not on file   Number of children: Not on file   Years of education: Not on file   Highest education level: Not on file  Occupational History   Not on file  Tobacco Use   Smoking status: Never    Passive exposure: Never   Smokeless tobacco: Never  Vaping Use   Vaping Use: Never used  Substance and Sexual Activity   Alcohol use: Never   Drug use: Never   Sexual activity: Never  Other Topics Concern   Not on file  Social History Narrative   Not on file   Social Determinants of Health   Financial Resource Strain: Not on file  Food Insecurity: Not on file  Transportation Needs: Not on file  Physical Activity: Not on file  Stress: Not on file  Social Connections: Not on file  Intimate Partner Violence: Not on file      Review of systems: All other review of systems negative except as mentioned in the HPI.   Physical Exam: Vitals:   11/10/22 1010  BP: (Abnormal) 98/58  Pulse: 64   Body mass index is 16.31 kg/m. Gen:      No acute distress HEENT:  sclera anicteric Abd:      soft, non-tender; no palpable  masses, no distension Ext:    No edema Neuro: alert and oriented x 3 Psych: normal mood and affect  Data Reviewed:  Reviewed labs, radiology imaging, old records and pertinent past GI work up   Assessment and Plan/Recommendations:  22 year old very pleasant male with IBS predominant constipation  He has had GI workup was negative for inflammatory bowel disease, celiac or Crohn's disease.  No neoplastic lesion on recent colonoscopy in 2020  Provided instructions for bowel purge with MiraLAX Start Benefiber 1 tablespoon twice daily with Increase dietary fiber and water intake to at least 60 ounces per day  If he continues to have persistent symptoms of constipation, advised patient to start Linzess 145 mcg daily, he was provided samples to try.  Advised him to call for prescription  Return in 3 months for follow-up   The patient was provided an opportunity to ask questions and all were answered. The patient agreed with the plan and demonstrated an understanding of the instructions.  Damaris Hippo , MD    CC: Bonnita Hollow, MD

## 2023-02-27 ENCOUNTER — Encounter: Payer: Self-pay | Admitting: Internal Medicine

## 2023-02-27 ENCOUNTER — Ambulatory Visit (INDEPENDENT_AMBULATORY_CARE_PROVIDER_SITE_OTHER): Payer: Medicaid Other | Admitting: Internal Medicine

## 2023-02-27 VITALS — BP 100/70 | HR 77 | Temp 99.7°F | Ht 64.0 in | Wt 97.0 lb

## 2023-02-27 DIAGNOSIS — K589 Irritable bowel syndrome without diarrhea: Secondary | ICD-10-CM

## 2023-02-27 DIAGNOSIS — R509 Fever, unspecified: Secondary | ICD-10-CM

## 2023-02-27 DIAGNOSIS — R059 Cough, unspecified: Secondary | ICD-10-CM

## 2023-02-27 DIAGNOSIS — J020 Streptococcal pharyngitis: Secondary | ICD-10-CM | POA: Diagnosis not present

## 2023-02-27 LAB — POCT INFLUENZA A/B

## 2023-02-27 LAB — POCT RAPID STREP A (OFFICE): Rapid Strep A Screen: NEGATIVE

## 2023-02-27 LAB — POC COVID19 BINAXNOW: SARS Coronavirus 2 Ag: NEGATIVE

## 2023-02-27 MED ORDER — PENICILLIN V POTASSIUM 500 MG PO TABS: 500.0000 mg | ORAL_TABLET | Freq: Two times a day (BID) | ORAL | 0 refills | Status: AC

## 2023-02-27 NOTE — Patient Instructions (Signed)
Tylenol or ibuprofen as needed for sore throat  Salt water gargles  Drink plenty of fluids , use Miralax once daily as needed for constipation

## 2023-02-27 NOTE — Progress Notes (Signed)
[facility] [address] [place] [address] Dept: [phone] Dept Fax: [phone]  Acute Care Office Visit  Subjective:   Daniel [name] 02/08/2001 02/27/2023  Chief Complaint  Patient presents with   Cough    Fever, dizziness, ear pain, hurts to swallow Constipation since becoming sick     HPI: Daniel [name] is a 22 yo M who c/o  fever (103F) fatigue, body aches, vomiting, right ear ache, sore throat, sinus congestion onset 6 days ago. He states symptoms have improved except for his sore throat and pain in right ear when he swallows.  Denies: CP, SHOB Treatments tried: Nyquil  No known sick contacts.   He states his IBS has flared up since becoming sick. Has been constipated. Last BM was today which was small and hard.   The following portions of the patient's history were reviewed and updated as appropriate: past medical history, past surgical history, family history, social history, allergies, medications, and problem list.   Patient Active Problem List   Diagnosis Date Noted   Patellofemoral arthralgia of both knees 10/12/2022   Constipation 08/15/2022   History of concussion 05/18/2022   History of migraine 05/18/2022   Malnutrition (HCC) 05/18/2022   Gastroesophageal reflux disease without esophagitis 04/28/2019   Cutaneous sarcoidosis 10/14/2014   Past Medical History:  Diagnosis Date   Abdominal pain, recurrent    GERD (gastroesophageal reflux disease)    IBS (irritable bowel syndrome)    Premature birth    Sarcoidosis    Subcutaneous   Vomiting    Past Surgical History:  Procedure Laterality Date   WISDOM TOOTH EXTRACTION     Family History  Problem Relation Age of Onset   Hyperlipidemia Father    Hypertension Maternal Grandmother    Sarcoidosis Maternal Grandmother    Diabetes Maternal Grandmother    Hyperlipidemia Maternal Grandmother    COPD Maternal Grandmother    Prostate cancer  Maternal Grandfather    Neuropathy Maternal Grandfather    GER disease Paternal Grandmother    Hypertension Paternal Grandmother    Hyperlipidemia Paternal Grandmother    Thyroid disease Paternal Grandmother    Heart attack Paternal Grandfather    Colon cancer Neg Hx    Esophageal cancer Neg Hx    Rectal cancer Neg Hx    Stomach cancer Neg Hx    Outpatient Medications Prior to Visit  Medication Sig Dispense Refill   acetaminophen (TYLENOL) 325 MG tablet Take by mouth as needed.     dicyclomine (BENTYL) 10 MG capsule Take 1 capsule (10 mg total) by mouth 3 (three) times daily with meals as needed (abdominal cramps). 270 capsule 1   ibuprofen (ADVIL) 200 MG tablet Take by mouth as needed.     omeprazole (PRILOSEC) 40 MG capsule Take 1 capsule (40 mg total) by mouth daily. (Patient taking differently: Take 40 mg by mouth as needed.) 90 capsule 1   Probiotic Product (PROBIOTIC DAILY PO) Take by mouth.     No facility-administered medications prior to visit.   No Known Allergies   ROS: A complete ROS was performed with pertinent positives/negatives noted in the HPI. The remainder of the ROS are negative.    Objective:   Today's Vitals   02/27/23 1331  BP: 100/70  Pulse: 77  Temp: 99.7 F (37.6 C)  TempSrc: Temporal  SpO2: 98%  Weight: 97 lb (44 kg)  Height: 5\' 4"  (1.626 m)    GENERAL: Well-appearing, in NAD.  Well nourished.  SKIN: Pink, warm and dry. No rash, lesion, ulceration, or ecchymoses.  HEENT:    HEAD: Normocephalic, non-traumatic.  EYES: Conjunctive pink without exudate. PERRL, EOMI.  EARS: External ear w/o redness, swelling, masses, or lesions. EAC clear. TM's intact, translucent w/o bulging, appropriate landmarks visualized.  NOSE: Septum midline w/o deformity. Nares patent, mucosa pink and non-inflamed w/o drainage. No sinus tenderness.  THROAT: Uvula midline. Oropharynx erythematous. Tonsils inflamed with exudate on left. Mucus membranes pink and moist.  NECK:  Trachea midline. Full ROM w/o pain or tenderness. No lymphadenopathy.  RESPIRATORY: Chest wall symmetrical. Respirations even and non-labored. Breath sounds clear to auscultation bilaterally.  CARDIAC: S1, S2 present, regular rate and rhythm. Peripheral pulses 2+ bilaterally.  EXTREMITIES: Without clubbing, cyanosis, or edema.  NEUROLOGIC:  Steady, even gait.  PSYCH/MENTAL STATUS: Alert, oriented x 3. Cooperative, appropriate mood and affect.    Results for orders placed or performed in visit on 02/27/23  POC COVID-19 BinaxNow  Result Value Ref Range   SARS Coronavirus 2 Ag Negative Negative  POCT Influenza A/B  Result Value Ref Range   Negative     Influenza B, POC     Negative    POCT rapid strep A  Result Value Ref Range   Rapid Strep A Screen Negative Negative      Assessment & Plan:  1. Strep throat - tylenol/ibuprofen as needed for pain  - salt water gargles  - change toothbrush on day 2 of ABX - POC COVID-19 BinaxNow - POCT Influenza A/B - POCT rapid strep A - penicillin v potassium (VEETID) 500 MG tablet; Take 1 tablet (500 mg total) by mouth 2 (two) times daily for 10 days.  Dispense: 20 tablet; Refill: 0  2. Fever, unspecified fever cause - POC COVID-19 BinaxNow - POCT Influenza A/B - POCT rapid strep A  3. Cough, unspecified type - POC COVID-19 BinaxNow - POCT Influenza A/B - POCT rapid strep A  4. Irritable bowel syndrome, unspecified type - drink plenty of fluids  - increase fiber in diet ( fiber food handout given at discharge)  - Miralax once daily as needed   Orders Placed This Encounter  Procedures   POC COVID-19 BinaxNow    Order Specific Question:   Previously tested for COVID-19    Answer:   No    Order Specific Question:   Resident in a congregate (group) care setting    Answer:   Unknown    Order Specific Question:   Employed in healthcare setting    Answer:   Unknown   POCT Influenza A/B   POCT rapid strep A    Return if symptoms  worsen or fail to improve.   Salvatore Decent, FNP

## 2023-04-09 ENCOUNTER — Other Ambulatory Visit: Payer: Self-pay | Admitting: Family Medicine

## 2023-04-09 DIAGNOSIS — K219 Gastro-esophageal reflux disease without esophagitis: Secondary | ICD-10-CM

## 2023-04-09 NOTE — Telephone Encounter (Signed)
Chart supports rx. Last OV: 02/27/2023 Next OV: 05/29/2023

## 2023-05-10 ENCOUNTER — Encounter (INDEPENDENT_AMBULATORY_CARE_PROVIDER_SITE_OTHER): Payer: Self-pay

## 2023-05-29 ENCOUNTER — Ambulatory Visit (INDEPENDENT_AMBULATORY_CARE_PROVIDER_SITE_OTHER): Payer: Medicaid Other | Admitting: Family Medicine

## 2023-05-29 ENCOUNTER — Encounter: Payer: Self-pay | Admitting: Family Medicine

## 2023-05-29 VITALS — BP 102/70 | HR 86 | Temp 98.0°F | Ht 64.0 in | Wt 98.0 lb

## 2023-05-29 DIAGNOSIS — Z0000 Encounter for general adult medical examination without abnormal findings: Secondary | ICD-10-CM | POA: Diagnosis not present

## 2023-05-29 DIAGNOSIS — K5900 Constipation, unspecified: Secondary | ICD-10-CM

## 2023-05-29 DIAGNOSIS — K219 Gastro-esophageal reflux disease without esophagitis: Secondary | ICD-10-CM | POA: Diagnosis not present

## 2023-05-29 DIAGNOSIS — D863 Sarcoidosis of skin: Secondary | ICD-10-CM

## 2023-05-29 DIAGNOSIS — E441 Mild protein-calorie malnutrition: Secondary | ICD-10-CM

## 2023-05-29 DIAGNOSIS — M222X2 Patellofemoral disorders, left knee: Secondary | ICD-10-CM

## 2023-05-29 DIAGNOSIS — Z23 Encounter for immunization: Secondary | ICD-10-CM

## 2023-05-29 DIAGNOSIS — M222X1 Patellofemoral disorders, right knee: Secondary | ICD-10-CM

## 2023-05-29 LAB — COMPREHENSIVE METABOLIC PANEL
ALT: 9 U/L (ref 0–53)
AST: 18 U/L (ref 0–37)
Albumin: 4.6 g/dL (ref 3.5–5.2)
Alkaline Phosphatase: 68 U/L (ref 39–117)
BUN: 7 mg/dL (ref 6–23)
CO2: 29 meq/L (ref 19–32)
Calcium: 9.7 mg/dL (ref 8.4–10.5)
Chloride: 101 meq/L (ref 96–112)
Creatinine, Ser: 0.83 mg/dL (ref 0.40–1.50)
GFR: 123.54 mL/min (ref >60.00)
Glucose, Bld: 90 mg/dL (ref 70–99)
Potassium: 4.5 meq/L (ref 3.5–5.1)
Sodium: 138 meq/L (ref 135–145)
Total Bilirubin: 1.1 mg/dL (ref 0.2–1.2)
Total Protein: 6.9 g/dL (ref 6.0–8.3)

## 2023-05-29 LAB — CBC WITH DIFFERENTIAL/PLATELET
Basophils Absolute: 0 10*3/uL (ref 0.0–0.1)
Basophils Relative: 0.3 % (ref 0.0–3.0)
Eosinophils Absolute: 0.1 10*3/uL (ref 0.0–0.7)
Eosinophils Relative: 1.3 % (ref 0.0–5.0)
HCT: 43.7 % (ref 39.0–52.0)
Hemoglobin: 15.1 g/dL (ref 13.0–17.0)
Lymphocytes Relative: 38.1 % (ref 12.0–46.0)
Lymphs Abs: 1.9 10*3/uL (ref 0.7–4.0)
MCHC: 34.7 g/dL (ref 30.0–36.0)
MCV: 88.4 fl (ref 78.0–100.0)
Monocytes Absolute: 0.4 10*3/uL (ref 0.1–1.0)
Monocytes Relative: 8.4 % (ref 3.0–12.0)
Neutro Abs: 2.5 10*3/uL (ref 1.4–7.7)
Neutrophils Relative %: 51.9 % (ref 43.0–77.0)
Platelets: 210 10*3/uL (ref 150.0–400.0)
RBC: 4.94 Mil/uL (ref 4.22–5.81)
RDW: 12.4 % (ref 11.5–15.5)
WBC: 4.9 10*3/uL (ref 4.0–10.5)

## 2023-05-29 LAB — URINALYSIS, ROUTINE W REFLEX MICROSCOPIC
Bilirubin Urine: NEGATIVE
Hgb urine dipstick: NEGATIVE
Ketones, ur: NEGATIVE
Leukocytes,Ua: NEGATIVE
Nitrite: NEGATIVE
RBC / HPF: NONE SEEN (ref 0–?)
Specific Gravity, Urine: >=1.03 — AB (ref 1.000–1.030)
Total Protein, Urine: NEGATIVE
Urine Glucose: NEGATIVE
Urobilinogen, UA: 1 (ref 0.0–1.0)
pH: 6 (ref 5.0–8.0)

## 2023-05-29 LAB — HEMOGLOBIN A1C: Hgb A1c MFr Bld: 4.9 % (ref 4.6–6.5)

## 2023-05-29 LAB — TRANSFERRIN: Transferrin: 224 mg/dL (ref 212.0–360.0)

## 2023-05-29 MED ORDER — BOOST HIGH PROTEIN PO LIQD: 1.0000 | Freq: Three times a day (TID) | ORAL | 3 refills | Status: AC

## 2023-05-29 MED ORDER — MULTI-VITAMIN/MINERALS PO TABS
1.0000 | ORAL_TABLET | Freq: Every day | ORAL | 3 refills | Status: AC
Start: 2023-05-29 — End: 2024-05-23

## 2023-05-29 NOTE — Patient Instructions (Addendum)
Continue taking omeprazole for GERD symptoms as currently prescribed. Consider starting a multivitamin with iron to ensure adequate nutrient intake. Schedule follow-up with gastroenterology to monitor ongoing constipation and related symptoms. Monitor any respiratory or skin symptoms related to cutaneous sarcoidosis and follow-up with pulmonology as needed. Consider getting flu, COVID, and tetanus vaccinations in the future

## 2023-05-29 NOTE — Assessment & Plan Note (Signed)
Use NSAIDs PRN, avoid regular use if possible.

## 2023-05-29 NOTE — Progress Notes (Signed)
Assessment  Assessment/Plan:   Problem List Items Addressed This Visit       Digestive   Gastroesophageal reflux disease without esophagitis    Stable, follows with gastroenterology Extensive work-up by GI since 2012, has followed up with them last seen around 2021 Endoscopy showed some esophagitis and gastritis, H. pylori was negative, tissue biopsies did not result in any dysplasia He has also had a gastric emptying study that was within normal limits Patient had negative lab work-up including IBD and celiac disease Continue omeprazole 40 mg        Musculoskeletal and Integument   Cutaneous sarcoidosis    Stable, continue follow-up with pulmonology. No respiratory involvement.      Patellofemoral arthralgia of both knees    Use NSAIDs PRN, avoid regular use if possible.        Other   Malnutrition (HCC)    Slight weight gain noted. No current supplement intake. Recommend multivitamin with iron and protein supplementation as tolerated. Encourage a balanced diet with adequate nutritional intake. Labs to monitor ongoing nutritional assessment.      Relevant Medications   Multiple Vitamins-Minerals (MULTIVITAMIN WITH MINERALS) tablet   feeding supplement (BOOST HIGH PROTEIN) LIQD   Other Relevant Orders   B12 and Folate Panel   Vitamin D 1,25 dihydroxy   Iron, TIBC and Ferritin Panel   CBC w/Diff   Comp Met (CMET)   Urinalysis, Routine w reflex microscopic   Hemoglobin A1C   Zinc   Transferrin   Prealbumin   Constipation    Continue MiraLAX and Benefiber as needed. Continue monitoring symptoms.      Other Visit Diagnoses     Encounter for well adult exam without abnormal findings    -  Primary   Immunization due           Medications Discontinued During This Encounter  Medication Reason   dicyclomine (BENTYL) 10 MG capsule    Recommended flu, COVID, and tetanus vaccines, which were declined by the patient.  Patient Counseling(The following  topics were reviewed and/or handout was given):  -Nutrition: Stressed importance of moderation in sodium/caffeine intake, saturated fat and cholesterol, caloric balance, sufficient intake of fresh fruits, vegetables, and fiber.  -Stressed the importance of regular exercise.   -Substance Abuse: Discussed cessation/primary prevention of tobacco, alcohol, or other drug use; driving or other dangerous activities under the influence; availability of treatment for abuse.   -Injury prevention: Discussed safety belts, safety helmets, smoke detector, smoking near bedding or upholstery.   -Sexuality: Discussed sexually transmitted diseases, partner selection, use of condoms, avoidance of unintended pregnancy and contraceptive alternatives.   -Dental health: Discussed importance of regular tooth brushing, flossing, and dental visits.  -Health maintenance and immunizations reviewed. Please refer to Health maintenance section.  Return to care in 1 year for next preventative visit.       Subjective:  Chief complaint Encounter date: 05/29/2023  Chief Complaint  Patient presents with   Annual Exam    Nonfasting.    Daniel [name] is a 22 y.o. male who presents today for his annual comprehensive physical exam.    History of Present Illness: Patient Daniel [name], a 22 year old male, presented for his annual physical examination. He reports persistent constipation with improvement upon using MiraLAX and Benefiber. Additionally, he used to use Linzess infrequently for exacerbations. He continues on omeprazole for GERD. The patient follows up with pulmonology for cutaneous sarcoidosis, with stable inflammatory markers and no current dermatologic or respiratory involvement. Mild  occasional knee pain is managed without regular NSAIDs. The patient has experienced some weight gain and has ceased taking dietary supplements due to gastrointestinal discomfort.  Lifestyle:  Diet: Currently not on any specific dietary  supplements or vitamins due to gastrointestinal discomfort. Exercise: Plays sports occasionally. Exercise can sometimes exacerbate muscle pain.  Review of Systems  Constitutional:  Negative for chills, diaphoresis, fever, malaise/fatigue and weight loss.  HENT:  Negative for congestion, ear discharge, ear pain and hearing loss.   Eyes:  Negative for blurred vision, double vision, photophobia, pain, discharge and redness.  Respiratory:  Negative for cough, sputum production, shortness of breath and wheezing.   Cardiovascular:  Negative for chest pain, palpitations, orthopnea, claudication, leg swelling and PND.  Gastrointestinal:  Positive for constipation and heartburn (improved on omeprazole). Negative for abdominal pain, blood in stool, diarrhea, melena, nausea and vomiting.  Genitourinary:  Negative for dysuria, flank pain, frequency, hematuria and urgency.  Musculoskeletal:  Positive for joint pain (knee pain, improved with NSAIDS prn). Negative for myalgias.  Skin:  Negative for itching and rash.  Neurological:  Negative for dizziness, tingling, tremors, speech change, seizures, loss of consciousness, weakness and headaches.  Endo/Heme/Allergies:  Negative for environmental allergies and polydipsia. Does not bruise/bleed easily.  Psychiatric/Behavioral:  Negative for depression, hallucinations, memory loss, substance abuse and suicidal ideas. The patient does not have insomnia.   All other systems reviewed and are negative.      05/29/2023    9:26 AM 05/18/2022    2:25 PM  GAD-7 Generalized Anxiety Disorder Screening Tool  1. Feeling Nervous, Anxious, or on Edge 0 0  2. Not Being Able to Stop or Control Worrying 0 0  3. Worrying Too Much About Different Things 0 0  4. Trouble Relaxing 0 0  5. Being So Restless it's Hard To Sit Still 0 0  6. Becoming Easily Annoyed or Irritable 0 0  7. Feeling Afraid As If Something Awful Might Happen 0 0  Total GAD-7 Score 0 0  Difficulty At Work,  Home, or Getting  Along With Others? Not difficult at all Not difficult at all      05/29/2023    9:26 AM 02/27/2023    1:31 PM 05/18/2022    2:24 PM 02/07/2022    3:17 PM  Depression screen PHQ 2/9  Decreased Interest 0 0 0 0  Down, Depressed, Hopeless 0 0 0 0  PHQ - 2 Score 0 0 0 0  Altered sleeping 0  0   Tired, decreased energy 0  0   Change in appetite 0  0   Feeling bad or failure about yourself  0  0   Trouble concentrating 0  0   Moving slowly or fidgety/restless 0  0   Suicidal thoughts 0  0   PHQ-9 Score 0  0   Difficult doing work/chores Not difficult at all  Not difficult at all     Health Maintenance Due  Topic Date Due   DTaP/Tdap/Td (8 - Td or Tdap) 02/12/2021   INFLUENZA VACCINE  Never done     Dental: UTD Vision: UTD  PMH:  The following were reviewed and entered/updated in epic: Past Medical History:  Diagnosis Date   Abdominal pain, recurrent    GERD (gastroesophageal reflux disease)    IBS (irritable bowel syndrome)    Premature birth    Sarcoidosis    Subcutaneous   Vomiting     Patient Active Problem List   Diagnosis Date Noted  Patellofemoral arthralgia of both knees 10/12/2022   Constipation 08/15/2022   History of concussion 05/18/2022   History of migraine 05/18/2022   Malnutrition (HCC) 05/18/2022   Gastroesophageal reflux disease without esophagitis 04/28/2019   Cutaneous sarcoidosis 10/14/2014    Past Surgical History:  Procedure Laterality Date   WISDOM TOOTH EXTRACTION      Family History  Problem Relation Age of Onset   Hyperlipidemia Father    Hypertension Maternal Grandmother    Sarcoidosis Maternal Grandmother    Diabetes Maternal Grandmother    Hyperlipidemia Maternal Grandmother    COPD Maternal Grandmother    Prostate cancer Maternal Grandfather    Neuropathy Maternal Grandfather    GER disease Paternal Grandmother    Hypertension Paternal Grandmother    Hyperlipidemia Paternal Grandmother    Thyroid disease  Paternal Grandmother    Heart attack Paternal Grandfather    Colon cancer Neg Hx    Esophageal cancer Neg Hx    Rectal cancer Neg Hx    Stomach cancer Neg Hx     Medications- reviewed and updated Outpatient Medications Prior to Visit  Medication Sig Dispense Refill   acetaminophen (TYLENOL) 325 MG tablet Take by mouth as needed.     ibuprofen (ADVIL) 200 MG tablet Take by mouth as needed.     omeprazole (PRILOSEC) 40 MG capsule TAKE 1 CAPSULE (40 MG TOTAL) BY MOUTH DAILY. 90 capsule 1   Probiotic Product (PROBIOTIC DAILY PO) Take by mouth.     dicyclomine (BENTYL) 10 MG capsule Take 1 capsule (10 mg total) by mouth 3 (three) times daily with meals as needed (abdominal cramps). 270 capsule 1   No facility-administered medications prior to visit.    No Known Allergies  Social History   Socioeconomic History   Marital status: Single    Spouse name: Not on file   Number of children: Not on file   Years of education: Not on file   Highest education level: Not on file  Occupational History   Not on file  Tobacco Use   Smoking status: Never    Passive exposure: Never   Smokeless tobacco: Never  Vaping Use   Vaping status: Never Used  Substance and Sexual Activity   Alcohol use: Never   Drug use: Never   Sexual activity: Never  Other Topics Concern   Not on file  Social History Narrative   Not on file   Social Determinants of Health   Financial Resource Strain: Not on file  Food Insecurity: Not on file  Transportation Needs: Not on file  Physical Activity: Not on file  Stress: Not on file  Social Connections: Not on file        Objective:  Physical Exam: BP 102/70 (BP Location: Left Arm, Patient Position: Sitting, Cuff Size: Large)   Pulse 86   Temp 98 F (36.7 C) (Temporal)   Ht 5\' 4"  (1.626 m)   Wt 98 lb (44.5 kg)   SpO2 99%   BMI 16.82 kg/m   Body mass index is 16.82 kg/m. Wt Readings from Last 3 Encounters:  05/29/23 98 lb (44.5 kg)  02/27/23 97 lb  (44 kg)  11/10/22 95 lb (43.1 kg)    Physical Exam Constitutional:      General: He is not in acute distress.    Appearance: He is underweight. He is not ill-appearing or toxic-appearing.  HENT:     Head: Normocephalic and atraumatic.     Right Ear: Hearing, tympanic membrane, ear canal  and external ear normal. There is no impacted cerumen.     Left Ear: Hearing, tympanic membrane, ear canal and external ear normal. There is no impacted cerumen.     Nose: Nose normal. No congestion.     Mouth/Throat:     Lips: No lesions.     Mouth: Mucous membranes are moist.     Pharynx: Oropharynx is clear. No oropharyngeal exudate.  Eyes:     General: No scleral icterus.       Right eye: No discharge.        Left eye: No discharge.     Conjunctiva/sclera: Conjunctivae normal.     Pupils: Pupils are equal, round, and reactive to light.  Neck:     Thyroid: No thyroid mass, thyromegaly or thyroid tenderness.  Cardiovascular:     Rate and Rhythm: Normal rate and regular rhythm.     Pulses: Normal pulses.     Heart sounds: Normal heart sounds.  Pulmonary:     Effort: Pulmonary effort is normal. No respiratory distress.     Breath sounds: Normal breath sounds.  Abdominal:     General: Abdomen is flat. Bowel sounds are normal.     Palpations: Abdomen is soft.  Musculoskeletal:        General: Normal range of motion.     Cervical back: Normal range of motion.     Right lower leg: No edema.     Left lower leg: No edema.  Lymphadenopathy:     Cervical: No cervical adenopathy.  Skin:    General: Skin is warm and dry.     Findings: No rash.  Neurological:     General: No focal deficit present.     Mental Status: He is alert and oriented to person, place, and time. Mental status is at baseline.     Deep Tendon Reflexes:     Reflex Scores:      Patellar reflexes are 2+ on the right side and 2+ on the left side. Psychiatric:        Mood and Affect: Mood normal.        Behavior: Behavior  normal.        Thought Content: Thought content normal.        Judgment: Judgment normal.         At today's visit, we discussed treatment options, associated risk and benefits, and engage in counseling as needed.  Additionally the following were reviewed: Past medical records, past medical and surgical history, family and social background, as well as relevant laboratory results, imaging findings, and specialty notes, where applicable.  This message was generated using dictation software, and as a result, it may contain unintentional typos or errors.  Nevertheless, extensive effort was made to accurately convey at the pertinent aspects of the patient visit.    There may have been are other unrelated non-urgent complaints, but due to the busy schedule and the amount of time already spent with him, time does not permit to address these issues at today's visit. Another appointment may have or has been requested to review these additional issues.   Thomes Dinning, MD, MS

## 2023-05-29 NOTE — Assessment & Plan Note (Addendum)
Slight weight gain noted. No current supplement intake. Recommend multivitamin with iron and protein supplementation as tolerated. Encourage a balanced diet with adequate nutritional intake. Labs to monitor ongoing nutritional assessment.

## 2023-05-29 NOTE — Assessment & Plan Note (Signed)
Stable, follows with gastroenterology Extensive work-up by GI since 2012, has followed up with them last seen around 2021 Endoscopy showed some esophagitis and gastritis, H. pylori was negative, tissue biopsies did not result in any dysplasia He has also had a gastric emptying study that was within normal limits Patient had negative lab work-up including IBD and celiac disease Continue omeprazole 40 mg

## 2023-05-29 NOTE — Assessment & Plan Note (Signed)
Stable, continue follow-up with pulmonology. No respiratory involvement.

## 2023-05-29 NOTE — Assessment & Plan Note (Signed)
Continue MiraLAX and Benefiber as needed. Continue monitoring symptoms.

## 2023-05-30 LAB — B12 AND FOLATE PANEL
Folate: >24.2 ng/mL (ref >5.9)
Vitamin B-12: 466 pg/mL (ref 211–911)

## 2023-06-07 LAB — PREALBUMIN: Prealbumin: 26 mg/dL (ref 21–43)

## 2023-06-07 LAB — VITAMIN D 1,25 DIHYDROXY
Vitamin D 1, 25 (OH)2 Total: 34 pg/mL (ref 18–72)
Vitamin D2 1, 25 (OH)2: <8 pg/mL
Vitamin D3 1, 25 (OH)2: 34 pg/mL

## 2023-06-07 LAB — IRON,TIBC AND FERRITIN PANEL
%SAT: 32 % (ref 20–48)
Ferritin: 72 ng/mL (ref 38–380)
Iron: 94 ug/dL (ref 50–195)
TIBC: 296 ug/dL (ref 250–425)

## 2023-06-07 LAB — ZINC: Zinc: 77 ug/dL (ref 60–130)

## 2023-07-09 ENCOUNTER — Encounter: Payer: Self-pay | Admitting: Family Medicine

## 2023-07-09 ENCOUNTER — Ambulatory Visit: Payer: Medicaid Other | Admitting: Family Medicine

## 2023-07-09 VITALS — BP 110/68 | HR 44 | Temp 98.3°F | Wt 99.4 lb

## 2023-07-09 DIAGNOSIS — J020 Streptococcal pharyngitis: Secondary | ICD-10-CM | POA: Diagnosis not present

## 2023-07-09 DIAGNOSIS — J029 Acute pharyngitis, unspecified: Secondary | ICD-10-CM | POA: Diagnosis not present

## 2023-07-09 DIAGNOSIS — J0110 Acute frontal sinusitis, unspecified: Secondary | ICD-10-CM

## 2023-07-09 DIAGNOSIS — R059 Cough, unspecified: Secondary | ICD-10-CM

## 2023-07-09 DIAGNOSIS — H66001 Acute suppurative otitis media without spontaneous rupture of ear drum, right ear: Secondary | ICD-10-CM

## 2023-07-09 LAB — POCT RAPID STREP A (OFFICE): Rapid Strep A Screen: POSITIVE — AB

## 2023-07-09 LAB — POCT INFLUENZA A/B
Influenza A, POC: NEGATIVE
Influenza B, POC: NEGATIVE

## 2023-07-09 LAB — POC COVID19 BINAXNOW: SARS Coronavirus 2 Ag: NEGATIVE

## 2023-07-09 MED ORDER — PROMETHAZINE-DM 6.25-15 MG/5ML PO SYRP
5.0000 mL | ORAL_SOLUTION | Freq: Four times a day (QID) | ORAL | 0 refills | Status: AC | PRN
Start: 2023-07-09 — End: ?

## 2023-07-09 MED ORDER — FLUTICASONE PROPIONATE 50 MCG/ACT NA SUSP
2.0000 | Freq: Every day | NASAL | 6 refills | Status: AC
Start: 2023-07-09 — End: ?

## 2023-07-09 MED ORDER — AMOXICILLIN-POT CLAVULANATE 875-125 MG PO TABS
1.0000 | ORAL_TABLET | Freq: Two times a day (BID) | ORAL | 0 refills | Status: AC
Start: 2023-07-09 — End: ?

## 2023-07-09 NOTE — Progress Notes (Unsigned)
Assessment/Plan:   Problem List Items Addressed This Visit   None Visit Diagnoses     Sore throat    -  Primary   Relevant Orders   POCT rapid strep A (Completed)   Cough, unspecified type       Relevant Orders   POC COVID-19 BinaxNow (Completed)   POCT Influenza A/B (Completed)       There are no discontinued medications.  No follow-ups on file.    Subjective:   Encounter date: 07/09/2023  Daniel [name] is a 22 y.o. male who has Cutaneous sarcoidosis; Gastroesophageal reflux disease without esophagitis; History of concussion; History of migraine; Malnutrition (HCC); Constipation; and Patellofemoral arthralgia of both knees on their problem list..   He  has a past medical history of Abdominal pain, recurrent, GERD (gastroesophageal reflux disease), IBS (irritable bowel syndrome), Premature birth, Sarcoidosis, and Vomiting.Marland Kitchen   He presents with chief complaint of Sore Throat (Runny nose, cough, sore throat, fever, body aches, fatigue x 2-3 days.) .   HPI:   ROS  Past Surgical History:  Procedure Laterality Date  . WISDOM TOOTH EXTRACTION      Outpatient Medications Prior to Visit  Medication Sig Dispense Refill  . acetaminophen (TYLENOL) 325 MG tablet Take by mouth as needed.    . feeding supplement (BOOST HIGH PROTEIN) LIQD Take 237 mLs by mouth 3 (three) times daily between meals. 63990 mL 3  . ibuprofen (ADVIL) 200 MG tablet Take by mouth as needed.    . Multiple Vitamins-Minerals (MULTIVITAMIN WITH MINERALS) tablet Take 1 tablet by mouth daily. 90 tablet 3  . omeprazole (PRILOSEC) 40 MG capsule TAKE 1 CAPSULE (40 MG TOTAL) BY MOUTH DAILY. 90 capsule 1  . Probiotic Product (PROBIOTIC DAILY PO) Take by mouth.     No facility-administered medications prior to visit.    Family History  Problem Relation Age of Onset  . Hyperlipidemia Father   . Hypertension Maternal Grandmother   . Sarcoidosis Maternal Grandmother   . Diabetes Maternal Grandmother   .  Hyperlipidemia Maternal Grandmother   . COPD Maternal Grandmother   . Prostate cancer Maternal Grandfather   . Neuropathy Maternal Grandfather   . GER disease Paternal Grandmother   . Hypertension Paternal Grandmother   . Hyperlipidemia Paternal Grandmother   . Thyroid disease Paternal Grandmother   . Heart attack Paternal Grandfather   . Colon cancer Neg Hx   . Esophageal cancer Neg Hx   . Rectal cancer Neg Hx   . Stomach cancer Neg Hx     Social History   Socioeconomic History  . Marital status: Single    Spouse name: Not on file  . Number of children: Not on file  . Years of education: Not on file  . Highest education level: Not on file  Occupational History  . Not on file  Tobacco Use  . Smoking status: Never    Passive exposure: Never  . Smokeless tobacco: Never  Vaping Use  . Vaping status: Never Used  Substance and Sexual Activity  . Alcohol use: Never  . Drug use: Never  . Sexual activity: Never  Other Topics Concern  . Not on file  Social History Narrative  . Not on file   Social Determinants of Health   Financial Resource Strain: Not on file  Food Insecurity: Not on file  Transportation Needs: Not on file  Physical Activity: Not on file  Stress: Not on file  Social Connections: Not on file  Intimate  Partner Violence: Not on file                                                                                                  Objective:  Physical Exam: BP 110/68 (BP Location: Right Arm, Patient Position: Sitting, Cuff Size: Large)   Pulse (!) 44   Temp 98.3 F (36.8 C) (Temporal)   Wt 99 lb 6.4 oz (45.1 kg)   SpO2 95%   BMI 17.06 kg/m     Physical Exam  No results found.  Recent Results (from the past 2160 hour(s))  B12 and Folate Panel     Status: None   Collection Time: 05/29/23 10:12 AM  Result Value Ref Range   Vitamin B-12 466 211 - 911 pg/mL   Folate >24.2 >5.9 ng/mL  Vitamin D 1,25 dihydroxy     Status: None   Collection  Time: 05/29/23 10:12 AM  Result Value Ref Range   Vitamin D 1, 25 (OH)2 Total 34 18 - 72 pg/mL   Vitamin D3 1, 25 (OH)2 34 pg/mL   Vitamin D2 1, 25 (OH)2 <8 pg/mL    Comment: (Note) Vitamin D3, 1,25(OH)2 indicates both endogenous  production and supplementation. Vitamin D2, 1,25(OH)2 is  an indicator of exogenous sources, such as diet or  supplementation. Interpretation and therapy are based on  measurement of Vitamin D, 1,25 (OH)2, Total. . This test was developed, and its analytical performance  characteristics have been determined by [employer]. It has not been cleared or approved by the  FDA. This assay has been validated pursuant to the CLIA  regulations and is used for clinical purposes. . For additional information, please refer to http://education.[facility].com/faq/FAQ199 (This link is being provided for  informational/educational purposes only.) . [facility] [address] [facility] [address] [place] [address] [phone] Junita Push L. Kingslee Dowse Caul, MD, PhD   Iron, TIBC and Ferritin Panel     Status: None   Collection Time: 05/29/23 10:12 AM  Result Value Ref Range   Iron 94 50 - 195 mcg/dL   TIBC 295 621 - 308 mcg/dL (calc)   %SAT 32 20 - 48 % (calc)   Ferritin 72 38 - 380 ng/mL  CBC w/Diff     Status: None   Collection Time: 05/29/23 10:12 AM  Result Value Ref Range   WBC 4.9 4.0 - 10.5 K/uL   RBC 4.94 4.22 - 5.81 Mil/uL   Hemoglobin 15.1 13.0 - 17.0 g/dL   HCT 65.7 84.6 - 96.2 %   MCV 88.4 78.0 - 100.0 fl   MCHC 34.7 30.0 - 36.0 g/dL   RDW 95.2 84.1 - 32.4 %   Platelets 210.0 150.0 - 400.0 K/uL   Neutrophils Relative % 51.9 43.0 - 77.0 %   Lymphocytes Relative 38.1 12.0 - 46.0 %   Monocytes Relative 8.4 3.0 - 12.0 %   Eosinophils Relative 1.3 0.0 - 5.0 %   Basophils Relative 0.3 0.0 - 3.0 %   Neutro Abs 2.5 1.4 - 7.7 K/uL   Lymphs Abs 1.9 0.7 - 4.0 K/uL   Monocytes  Absolute 0.4 0.1 - 1.0 K/uL   Eosinophils Absolute 0.1 0.0 -  0.7 K/uL   Basophils Absolute 0.0 0.0 - 0.1 K/uL  Comp Met (CMET)     Status: None   Collection Time: 05/29/23 10:12 AM  Result Value Ref Range   Sodium 138 135 - 145 mEq/L   Potassium 4.5 3.5 - 5.1 mEq/L   Chloride 101 96 - 112 mEq/L   CO2 29 19 - 32 mEq/L   Glucose, Bld 90 70 - 99 mg/dL   BUN 7 6 - 23 mg/dL   Creatinine, Ser 1.61 0.40 - 1.50 mg/dL   Total Bilirubin 1.1 0.2 - 1.2 mg/dL   Alkaline Phosphatase 68 39 - 117 U/L   AST 18 0 - 37 U/L   ALT 9 0 - 53 U/L   Total Protein 6.9 6.0 - 8.3 g/dL   Albumin 4.6 3.5 - 5.2 g/dL   GFR 096.04 >54.09 mL/min    Comment: Calculated using the CKD-EPI Creatinine Equation (2021)   Calcium 9.7 8.4 - 10.5 mg/dL  Urinalysis, Routine w reflex microscopic     Status: Abnormal   Collection Time: 05/29/23 10:12 AM  Result Value Ref Range   Color, Urine Dark Yellow (A) Yellow;Lt. Yellow;Straw;Dark Yellow;Amber;Green;Red;Brown   APPearance CLEAR Clear;Turbid;Slightly Cloudy;Cloudy   Specific Gravity, Urine >=1.030 (A) 1.000 - 1.030   pH 6.0 5.0 - 8.0   Total Protein, Urine NEGATIVE Negative   Urine Glucose NEGATIVE Negative   Ketones, ur NEGATIVE Negative   Bilirubin Urine NEGATIVE Negative   Hgb urine dipstick NEGATIVE Negative   Urobilinogen, UA 1.0 0.0 - 1.0   Leukocytes,Ua NEGATIVE Negative   Nitrite NEGATIVE Negative   WBC, UA 0-2/hpf 0-2/hpf   RBC / HPF none seen 0-2/hpf   Mucus, UA Presence of (A) None   Squamous Epithelial / HPF Rare(0-4/hpf) Rare(0-4/hpf)  Hemoglobin A1C     Status: None   Collection Time: 05/29/23 10:12 AM  Result Value Ref Range   Hgb A1c MFr Bld 4.9 4.6 - 6.5 %    Comment: Glycemic Control Guidelines for People with Diabetes:Non Diabetic:  <6%Goal of Therapy: <7%Additional Action Suggested:  >8%   Zinc     Status: None   Collection Time: 05/29/23 10:12 AM  Result Value Ref Range   Zinc 77 60 - 130 mcg/dL    Comment: (Note) This test was developed and its analytical performance characteristics have been  determined by [employer].  It has not been cleared or approved by the FDA. This  assay has been validated pursuant to the CLIA regulations  and is used for clinical purposes. . [facility] [address] [place] [address] [phone] Junita Push L. Armanie Ullmer Caul, MD, PhD   Transferrin     Status: None   Collection Time: 05/29/23 10:12 AM  Result Value Ref Range   Transferrin 224.0 212.0 - 360.0 mg/dL  Prealbumin     Status: None   Collection Time: 05/29/23 10:12 AM  Result Value Ref Range   Prealbumin 26 21 - 43 mg/dL  POCT rapid strep A     Status: Abnormal   Collection Time: 07/09/23  3:41 PM  Result Value Ref Range   Rapid Strep A Screen Positive (A) Negative  POC COVID-19 BinaxNow     Status: None   Collection Time: 07/09/23  3:47 PM  Result Value Ref Range   SARS Coronavirus 2 Ag Negative Negative  POCT Influenza A/B     Status:  None   Collection Time: 07/09/23  3:47 PM  Result Value Ref Range   Influenza A, POC Negative Negative   Influenza B, POC Negative Negative        Garner Nash, MD, MS

## 2023-07-11 DIAGNOSIS — H66001 Acute suppurative otitis media without spontaneous rupture of ear drum, right ear: Secondary | ICD-10-CM | POA: Insufficient documentation

## 2023-07-11 DIAGNOSIS — J0110 Acute frontal sinusitis, unspecified: Secondary | ICD-10-CM | POA: Insufficient documentation

## 2023-07-11 DIAGNOSIS — J020 Streptococcal pharyngitis: Secondary | ICD-10-CM | POA: Insufficient documentation

## 2023-07-26 ENCOUNTER — Ambulatory Visit: Payer: Medicaid Other | Admitting: Family Medicine

## 2023-07-26 ENCOUNTER — Encounter: Payer: Self-pay | Admitting: Family Medicine

## 2023-07-26 VITALS — BP 98/74 | HR 84 | Temp 97.6°F | Wt 96.0 lb

## 2023-07-26 DIAGNOSIS — H66001 Acute suppurative otitis media without spontaneous rupture of ear drum, right ear: Secondary | ICD-10-CM | POA: Diagnosis not present

## 2023-07-26 MED ORDER — PREDNISONE 20 MG PO TABS
40.0000 mg | ORAL_TABLET | Freq: Every day | ORAL | 0 refills | Status: AC
Start: 2023-07-26 — End: 2023-07-31

## 2023-07-26 MED ORDER — AZITHROMYCIN 250 MG PO TABS
ORAL_TABLET | ORAL | 0 refills | Status: AC
Start: 2023-07-26 — End: 2023-07-31

## 2023-07-26 NOTE — Progress Notes (Signed)
Assessment/Plan:   Problem List Items Addressed This Visit       Nervous and Auditory   Non-recurrent acute suppurative otitis media of right ear without spontaneous rupture of tympanic membrane - Primary    Persistent right ear pain with middle ear effusion and sore throat likely secondary to residual inflammation and eustachian tube dysfunction post-strep throat.   Plan: Medications: Azithromycin (Z-Pak) Prednisone: 40 mg once daily for 5 days to manage inflammation. If symptoms do not improve post-medication, follow up with ENT for further evaluation. Advised to monitor symptoms and complete the prescribed course of medications. Educated on the significance of completing antibiotic therapy even if symptoms improve early.      Relevant Medications   azithromycin (ZITHROMAX) 250 MG tablet   predniSONE (DELTASONE) 20 MG tablet    There are no discontinued medications.  No follow-ups on file.    Subjective:   Encounter date: 07/26/2023  Daniel [name] is a 22 y.o. male who has Cutaneous sarcoidosis; Gastroesophageal reflux disease without esophagitis; History of concussion; History of migraine; Malnutrition (HCC); Constipation; Patellofemoral arthralgia of both knees; Strep pharyngitis; Non-recurrent acute suppurative otitis media of right ear without spontaneous rupture of tympanic membrane; and Acute non-recurrent frontal sinusitis on their problem list..   He  has a past medical history of Abdominal pain, recurrent, GERD (gastroesophageal reflux disease), IBS (irritable bowel syndrome), Premature birth, Sarcoidosis, and Vomiting..   Chief Complaint: Ongoing right ear pain and throat pain, especially on the right side.  History of Present Illness: The patient was previously treated for strep throat and possible ear infection with Augmentin and promethazine DM syrup. Symptoms seemed to improve initially, but right ear pain and throat pain returned, especially upon  swallowing. This includes radiation of the pain to the ear. Also endorses headache. No history of frequent ear infections as an adult, but similar episodes in childhood and once in 2023 (left ear). Currently no fever, chills, nasal congestion, sinus pain, dyspnea, or cough. The patient reports no oral sexual activity.  Past Surgical History:  Procedure Laterality Date   WISDOM TOOTH EXTRACTION      Outpatient Medications Prior to Visit  Medication Sig Dispense Refill   acetaminophen (TYLENOL) 325 MG tablet Take by mouth as needed.     feeding supplement (BOOST HIGH PROTEIN) LIQD Take 237 mLs by mouth 3 (three) times daily between meals. 63990 mL 3   fluticasone (FLONASE) 50 MCG/ACT nasal spray Place 2 sprays into both nostrils daily. 16 g 6   ibuprofen (ADVIL) 200 MG tablet Take by mouth as needed.     Multiple Vitamins-Minerals (MULTIVITAMIN WITH MINERALS) tablet Take 1 tablet by mouth daily. 90 tablet 3   omeprazole (PRILOSEC) 40 MG capsule TAKE 1 CAPSULE (40 MG TOTAL) BY MOUTH DAILY. 90 capsule 1   Probiotic Product (PROBIOTIC DAILY PO) Take by mouth.     amoxicillin-clavulanate (AUGMENTIN) 875-125 MG tablet Take 1 tablet by mouth 2 (two) times daily. (Patient not taking: Reported on 07/26/2023) 20 tablet 0   promethazine-dextromethorphan (PROMETHAZINE-DM) 6.25-15 MG/5ML syrup Take 5 mLs by mouth 4 (four) times daily as needed. (Patient not taking: Reported on 07/26/2023) 118 mL 0   No facility-administered medications prior to visit.    Family History  Problem Relation Age of Onset   Hyperlipidemia Father    Hypertension Maternal Grandmother    Sarcoidosis Maternal Grandmother    Diabetes Maternal Grandmother    Hyperlipidemia Maternal Grandmother    COPD Maternal Grandmother  Prostate cancer Maternal Grandfather    Neuropathy Maternal Grandfather    GER disease Paternal Grandmother    Hypertension Paternal Grandmother    Hyperlipidemia Paternal Grandmother    Thyroid disease  Paternal Grandmother    Heart attack Paternal Grandfather    Colon cancer Neg Hx    Esophageal cancer Neg Hx    Rectal cancer Neg Hx    Stomach cancer Neg Hx     Social History   Socioeconomic History   Marital status: Single    Spouse name: Not on file   Number of children: Not on file   Years of education: Not on file   Highest education level: Not on file  Occupational History   Not on file  Tobacco Use   Smoking status: Never    Passive exposure: Never   Smokeless tobacco: Never  Vaping Use   Vaping status: Never Used  Substance and Sexual Activity   Alcohol use: Never   Drug use: Never   Sexual activity: Never  Other Topics Concern   Not on file  Social History Narrative   Not on file   Social Determinants of Health   Financial Resource Strain: Not on file  Food Insecurity: Not on file  Transportation Needs: Not on file  Physical Activity: Not on file  Stress: Not on file  Social Connections: Not on file  Intimate Partner Violence: Not on file                                                                                                  Objective:  Physical Exam: BP 98/74 (BP Location: Left Arm, Patient Position: Sitting, Cuff Size: Large)   Pulse 84   Temp 97.6 F (36.4 C) (Temporal)   Wt 96 lb (43.5 kg)   SpO2 98%   BMI 16.48 kg/m     Physical Exam Constitutional:      Appearance: Normal appearance.  HENT:     Head: Normocephalic and atraumatic.     Right Ear: Hearing normal. No drainage. A middle ear effusion is present. Tympanic membrane is injected and erythematous.     Left Ear: Hearing normal.     Nose: Nose normal.     Mouth/Throat:     Mouth: Mucous membranes are moist.     Tongue: No lesions.     Pharynx: Oropharynx is clear.     Tonsils: No tonsillar exudate.  Eyes:     General: No scleral icterus.       Right eye: No discharge.        Left eye: No discharge.     Extraocular Movements: Extraocular movements intact.   Cardiovascular:     Rate and Rhythm: Normal rate and regular rhythm.     Heart sounds: Normal heart sounds.  Pulmonary:     Effort: Pulmonary effort is normal.     Breath sounds: Normal breath sounds.  Abdominal:     Palpations: Abdomen is soft.     Tenderness: There is no abdominal tenderness.  Lymphadenopathy:     Cervical: Cervical adenopathy present.  Right cervical: Superficial cervical adenopathy present.  Skin:    General: Skin is warm.     Findings: No rash.  Neurological:     General: No focal deficit present.     Mental Status: He is alert.     Cranial Nerves: No cranial nerve deficit.  Psychiatric:        Mood and Affect: Mood normal.        Behavior: Behavior normal.        Thought Content: Thought content normal.        Judgment: Judgment normal.     No results found.  Recent Results (from the past 2160 hour(s))  B12 and Folate Panel     Status: None   Collection Time: 05/29/23 10:12 AM  Result Value Ref Range   Vitamin B-12 466 211 - 911 pg/mL   Folate >24.2 >5.9 ng/mL  Vitamin D 1,25 dihydroxy     Status: None   Collection Time: 05/29/23 10:12 AM  Result Value Ref Range   Vitamin D 1, 25 (OH)2 Total 34 18 - 72 pg/mL   Vitamin D3 1, 25 (OH)2 34 pg/mL   Vitamin D2 1, 25 (OH)2 <8 pg/mL    Comment: (Note) Vitamin D3, 1,25(OH)2 indicates both endogenous  production and supplementation. Vitamin D2, 1,25(OH)2 is  an indicator of exogenous sources, such as diet or  supplementation. Interpretation and therapy are based on  measurement of Vitamin D, 1,25 (OH)2, Total. . This test was developed, and its analytical performance  characteristics have been determined by [employer]. It has not been cleared or approved by the  FDA. This assay has been validated pursuant to the CLIA  regulations and is used for clinical purposes. . For additional information, please refer to http://education.[facility].com/faq/FAQ199 (This link is being provided  for  informational/educational purposes only.) . [facility] [address] [place] [address] [phone] Junita Push L. Meshulem Onorato Caul, MD, PhD   Iron, TIBC and Ferritin Panel     Status: None   Collection Time: 05/29/23 10:12 AM  Result Value Ref Range   Iron 94 50 - 195 mcg/dL   TIBC 272 536 - 644 mcg/dL (calc)   %SAT 32 20 - 48 % (calc)   Ferritin 72 38 - 380 ng/mL  CBC w/Diff     Status: None   Collection Time: 05/29/23 10:12 AM  Result Value Ref Range   WBC 4.9 4.0 - 10.5 K/uL   RBC 4.94 4.22 - 5.81 Mil/uL   Hemoglobin 15.1 13.0 - 17.0 g/dL   HCT 03.4 74.2 - 59.5 %   MCV 88.4 78.0 - 100.0 fl   MCHC 34.7 30.0 - 36.0 g/dL   RDW 63.8 75.6 - 43.3 %   Platelets 210.0 150.0 - 400.0 K/uL   Neutrophils Relative % 51.9 43.0 - 77.0 %   Lymphocytes Relative 38.1 12.0 - 46.0 %   Monocytes Relative 8.4 3.0 - 12.0 %   Eosinophils Relative 1.3 0.0 - 5.0 %   Basophils Relative 0.3 0.0 - 3.0 %   Neutro Abs 2.5 1.4 - 7.7 K/uL   Lymphs Abs 1.9 0.7 - 4.0 K/uL   Monocytes Absolute 0.4 0.1 - 1.0 K/uL   Eosinophils Absolute 0.1 0.0 - 0.7 K/uL   Basophils Absolute 0.0 0.0 - 0.1 K/uL  Comp Met (CMET)     Status: None   Collection Time: 05/29/23 10:12 AM  Result Value Ref Range   Sodium 138 135 - 145 mEq/L  Potassium 4.5 3.5 - 5.1 mEq/L   Chloride 101 96 - 112 mEq/L   CO2 29 19 - 32 mEq/L   Glucose, Bld 90 70 - 99 mg/dL   BUN 7 6 - 23 mg/dL   Creatinine, Ser 9.14 0.40 - 1.50 mg/dL   Total Bilirubin 1.1 0.2 - 1.2 mg/dL   Alkaline Phosphatase 68 39 - 117 U/L   AST 18 0 - 37 U/L   ALT 9 0 - 53 U/L   Total Protein 6.9 6.0 - 8.3 g/dL   Albumin 4.6 3.5 - 5.2 g/dL   GFR 782.95 >62.13 mL/min    Comment: Calculated using the CKD-EPI Creatinine Equation (2021)   Calcium 9.7 8.4 - 10.5 mg/dL  Urinalysis, Routine w reflex microscopic     Status: Abnormal   Collection Time: 05/29/23 10:12 AM  Result Value Ref Range   Color, Urine Dark Yellow (A) Yellow;Lt.  Yellow;Straw;Dark Yellow;Amber;Green;Red;Brown   APPearance CLEAR Clear;Turbid;Slightly Cloudy;Cloudy   Specific Gravity, Urine >=1.030 (A) 1.000 - 1.030   pH 6.0 5.0 - 8.0   Total Protein, Urine NEGATIVE Negative   Urine Glucose NEGATIVE Negative   Ketones, ur NEGATIVE Negative   Bilirubin Urine NEGATIVE Negative   Hgb urine dipstick NEGATIVE Negative   Urobilinogen, UA 1.0 0.0 - 1.0   Leukocytes,Ua NEGATIVE Negative   Nitrite NEGATIVE Negative   WBC, UA 0-2/hpf 0-2/hpf   RBC / HPF none seen 0-2/hpf   Mucus, UA Presence of (A) None   Squamous Epithelial / HPF Rare(0-4/hpf) Rare(0-4/hpf)  Hemoglobin A1C     Status: None   Collection Time: 05/29/23 10:12 AM  Result Value Ref Range   Hgb A1c MFr Bld 4.9 4.6 - 6.5 %    Comment: Glycemic Control Guidelines for People with Diabetes:Non Diabetic:  <6%Goal of Therapy: <7%Additional Action Suggested:  >8%   Zinc     Status: None   Collection Time: 05/29/23 10:12 AM  Result Value Ref Range   Zinc 77 60 - 130 mcg/dL    Comment: (Note) This test was developed and its analytical performance characteristics have been determined by [employer].  It has not been cleared or approved by the FDA. This  assay has been validated pursuant to the CLIA regulations  and is used for clinical purposes. . [facility] [address] [place] [address] [phone] Junita Push L. Jadavion Spoelstra Caul, MD, PhD   Transferrin     Status: None   Collection Time: 05/29/23 10:12 AM  Result Value Ref Range   Transferrin 224.0 212.0 - 360.0 mg/dL  Prealbumin     Status: None   Collection Time: 05/29/23 10:12 AM  Result Value Ref Range   Prealbumin 26 21 - 43 mg/dL  POCT rapid strep A     Status: Abnormal   Collection Time: 07/09/23  3:41 PM  Result Value Ref Range   Rapid Strep A Screen Positive (A) Negative  POC COVID-19 [employer]Now     Status: None   Collection Time: 07/09/23  3:47 PM  Result Value Ref Range   SARS Coronavirus 2  Ag Negative Negative  POCT Influenza A/B     Status: None   Collection Time: 07/09/23  3:47 PM  Result Value Ref Range   Influenza A, POC Negative Negative   Influenza B, POC Negative Negative        Garner Nash, MD, MS

## 2023-07-26 NOTE — Assessment & Plan Note (Signed)
Persistent right ear pain with middle ear effusion and sore throat likely secondary to residual inflammation and eustachian tube dysfunction post-strep throat.   Plan: Medications: Azithromycin (Z-Pak) Prednisone: 40 mg once daily for 5 days to manage inflammation. If symptoms do not improve post-medication, follow up with ENT for further evaluation. Advised to monitor symptoms and complete the prescribed course of medications. Educated on the significance of completing antibiotic therapy even if symptoms improve early.

## 2023-10-04 ENCOUNTER — Other Ambulatory Visit: Payer: Self-pay | Admitting: Family Medicine

## 2023-10-04 DIAGNOSIS — K219 Gastro-esophageal reflux disease without esophagitis: Secondary | ICD-10-CM

## 2023-10-15 ENCOUNTER — Encounter: Payer: Self-pay | Admitting: Internal Medicine

## 2023-10-15 ENCOUNTER — Ambulatory Visit: Payer: Medicaid Other | Attending: Internal Medicine | Admitting: Internal Medicine

## 2023-10-15 VITALS — BP 106/69 | HR 76 | Resp 14 | Ht 65.0 in | Wt 98.0 lb

## 2023-10-15 DIAGNOSIS — G5701 Lesion of sciatic nerve, right lower limb: Secondary | ICD-10-CM | POA: Insufficient documentation

## 2023-10-15 DIAGNOSIS — D863 Sarcoidosis of skin: Secondary | ICD-10-CM | POA: Diagnosis not present

## 2023-10-15 MED ORDER — TRIAMCINOLONE ACETONIDE 0.1 % EX CREA: 1.0000 | TOPICAL_CREAM | Freq: Two times a day (BID) | CUTANEOUS | 0 refills | Status: DC | PRN

## 2023-10-15 NOTE — Progress Notes (Signed)
Office Visit Note  Patient: Daniel [name]             Date of Birth: [birth year]           MRN: [id number]             PCP: Garnette Gunner, MD Referring: Garnette Gunner, MD Visit Date: 10/15/2023   Subjective:  Follow-up    Discussed the use of AI scribe software for clinical note transcription with the patient, who gave verbal consent to proceed.  History of Present Illness   Daniel [name] is a 23 y.o. male here for follow up for cutaneous sarcoidosis currently under observation after visit last year. The patient also reports intermittent facial rashes, specifically around the nose. The rashes, described as dark and slightly raised, occur approximately once a month and last for a day or two.  He also reports intermittent right knee pain and bilateral hand tremors. They report that the knee pain is localized to the right side and has been ongoing. The tremors, described as shaking, occur during activities that involve holding objects. The patient denies any associated functional impairment such as unintentional dropping of items. They report a family history of essential tremors in extended family members and an elderly grandparent.  Additionally, the patient has been experiencing numbness in their legs, but denies any carpal tunnel-like symptoms in their hands. They also report a history of multiple ear infections and strep throat, but have not required any specialized ENT care.  The patient has a history of irritable bowel syndrome (IBS), which previously caused constipation for a month. They have tried increasing fiber intake and using stool softeners without significant improvement. They were prescribed Linzess, but report not taking it regularly due to its disruptive effects on their day.  Lastly, the patient has noticed an increase in the amount of gray hair, but denies any hair thinning or scalp rashes. They report no significant weight changes and deny any issues with  cold weather tolerance.   Previous HPI 10/12/22 TAYVIEN KINLAW is a 23 y.o. 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 He was diagnosed age 89 with skin biopsy for facial lesion with rash along the borders of his nose on both sides. Initial treatment with topical steroids for these rashes. He was never on long term DMARD treatment for this condition and no known systemic disease. No recent skin problems besides residual hyperpigmented spots and he also notices few white discolored spots on his torso and arms. He has eye pain and headaches intermittent most often  on the left side with no inflammatory findings noted with his eye exam including slit lamp evaluation. He does not complain of significant dryness. He does have some seasonal or environmental allergies with rhinitis but takes medication only as needed for symptoms. He previously saw rheumatology with monitoring including serum inflammatory markers and chest xray normal monitoring from 2016 in pediatric rheumatology clinic then later with [facility]. Most recent checked in November with PCP office. He is active playing sports without difficulty but occasionally feels chest tightness at other times such as vacuuming his home. This is not associated with chest pain or cough. He gets joint pain intermittently most often at his wrists and knees. Pain is usually after increased physical activity such as playing basketball. There has been some knee swelling in the past but usually pain is not associated with visible changes. He did have a fall with rib and medial knee pain in the  past but no fracture and no surgical intervention required. His other active problem is chronic GI issue with constipation predominant IBS and GERD with unintentional weight loss and difficulty gaining weight. He had GI evaluation for this including a normal colonoscopy. Linzess has been very help for IBS symptoms. He denies any trouble with swallowing or choking on food or  liquids.   Review of Systems  Constitutional:  Positive for fatigue.  HENT:  Negative for mouth sores and mouth dryness.   Eyes:  Negative for dryness.  Respiratory:  Positive for shortness of breath.   Cardiovascular:  Negative for chest pain and palpitations.  Gastrointestinal:  Positive for constipation and diarrhea. Negative for blood in stool.  Endocrine: Negative for increased urination.  Genitourinary:  Negative for involuntary urination.  Musculoskeletal:  Positive for joint pain and joint pain. Negative for gait problem, joint swelling, myalgias, muscle weakness, morning stiffness, muscle tenderness and myalgias.  Skin:  Negative for color change, rash, hair loss and sensitivity to sunlight.  Allergic/Immunologic: Positive for susceptible to infections.  Neurological:  Negative for dizziness and headaches.  Hematological:  Negative for swollen glands.  Psychiatric/Behavioral:  Positive for sleep disturbance. Negative for depressed mood. The patient is not nervous/anxious.     PMFS History:  Patient Active Problem List   Diagnosis Date Noted   Piriformis syndrome of right side 10/15/2023   Strep pharyngitis 07/11/2023   Non-recurrent acute suppurative otitis media of right ear without spontaneous rupture of tympanic membrane 07/11/2023   Acute non-recurrent frontal sinusitis 07/11/2023   Patellofemoral arthralgia of both knees 10/12/2022   Constipation 08/15/2022   History of concussion 05/18/2022   History of migraine 05/18/2022   Malnutrition (HCC) 05/18/2022   Gastroesophageal reflux disease without esophagitis 04/28/2019   Cutaneous sarcoidosis 10/14/2014    Past Medical History:  Diagnosis Date   Abdominal pain, recurrent    GERD (gastroesophageal reflux disease)    IBS (irritable bowel syndrome)    Premature birth    Sarcoidosis    Subcutaneous   Vomiting     Family History  Problem Relation Age of Onset   Hyperlipidemia Father    Hypertension Maternal  Grandmother    Sarcoidosis Maternal Grandmother    Diabetes Maternal Grandmother    Hyperlipidemia Maternal Grandmother    COPD Maternal Grandmother    Prostate cancer Maternal Grandfather    Neuropathy Maternal Grandfather    GER disease Paternal Grandmother    Hypertension Paternal Grandmother    Hyperlipidemia Paternal Grandmother    Thyroid disease Paternal Grandmother    Heart attack Paternal Grandfather    Colon cancer Neg Hx    Esophageal cancer Neg Hx    Rectal cancer Neg Hx    Stomach cancer Neg Hx    Past Surgical History:  Procedure Laterality Date   WISDOM TOOTH EXTRACTION     Social History   Social History Narrative   Not on file   Immunization History  Administered Date(s) Administered   DTaP 04/10/2000, 06/12/2000, 08/09/2000, 05/29/2001, 07/05/2004   HIB (PRP-OMP) 04/10/2000, 06/12/2000, 08/09/2000, 05/29/2001   HPV 9-valent 02/13/2011, 02/15/2012, 02/14/2013   Hepatitis A 02/25/2009, 02/08/2010   Hepatitis B 04/10/2000, 06/12/2000, 11/06/2000   IPV 04/10/2000, 06/12/2000, 11/06/2000, 07/05/2004   MMR 02/06/2001, 07/05/2004   MenQuadfi_Meningococcal Groups ACYW Conjugate 02/13/2011, 02/08/2017   Meningococcal B, OMV 02/08/2017, 02/12/2018   PFIZER(Purple Top)SARS-COV-2 Vaccination 04/12/2020, 05/04/2020   Pneumococcal Conjugate PCV 7 08/09/2000, 11/06/2000, 02/06/2001   Td 02/13/2011   Tdap 02/13/2011   Varicella  02/06/2001, 07/04/2005     Objective: Vital Signs: BP 106/69 (BP Location: Left Arm, Patient Position: Sitting, Cuff Size: Normal)   Pulse 76   Resp 14   Ht 5\' 5"  (1.651 m)   Wt 98 lb (44.5 kg)   BMI 16.31 kg/m    Physical Exam Eyes:     Conjunctiva/sclera: Conjunctivae normal.  Cardiovascular:     Rate and Rhythm: Normal rate and regular rhythm.  Pulmonary:     Effort: Pulmonary effort is normal.     Breath sounds: Normal breath sounds.  Musculoskeletal:     Right lower leg: No edema.     Left lower leg: No edema.   Lymphadenopathy:     Cervical: No cervical adenopathy.  Skin:    General: Skin is warm and dry.     Findings: No rash.  Neurological:     Mental Status: He is alert.  Psychiatric:        Mood and Affect: Mood normal.      Musculoskeletal Exam:  Shoulders full ROM no tenderness or swelling Elbows full ROM no tenderness or swelling Wrists full ROM no tenderness or swelling Fingers full ROM no tenderness or swelling Mild right lateral hip and gluteal tenderness to pressure without radiation, normal hip rotation Right knee medal and lateral tenderness to pressure, no swelling, full ROM   Investigation: No additional findings.  Imaging: No results found.  Recent Labs: Lab Results  Component Value Date   WBC 4.9 05/29/2023   HGB 15.1 05/29/2023   PLT 210.0 05/29/2023   NA 138 05/29/2023   K 4.5 05/29/2023   CL 101 05/29/2023   CO2 29 05/29/2023   GLUCOSE 90 05/29/2023   BUN 7 05/29/2023   CREATININE 0.83 05/29/2023   BILITOT 1.1 05/29/2023   ALKPHOS 68 05/29/2023   AST 18 05/29/2023   ALT 9 05/29/2023   PROT 6.9 05/29/2023   ALBUMIN 4.6 05/29/2023   CALCIUM 9.7 05/29/2023    Speciality Comments: No specialty comments available.  Procedures:  No procedures performed Allergies: Patient has no known allergies.   Assessment / Plan:     Visit Diagnoses: Cutaneous sarcoidosis - Plan: Sedimentation rate, C-reactive protein, Angiotensin converting enzyme, triamcinolone cream (KENALOG) 0.1 % Recurrent dark rash on the sides of the nose, occurring approximately once a month and lasting a few days. No other skin rashes noted. -Prescribe Triamcinolone 0.1% to apply twice daily to affected areas as needed. -Continue monitoring for signs of systemic sarcoidosis.  Tremors Bilateral hand tremors, intermittent, not affecting function. Family history of essential tremor in extended family. -Continue monitoring symptoms. Consider neurology referral if symptoms  worsen.  Piriformis syndrome of right side Possible cause of leg numbness and pain. -Provide patient with information about piriformis syndrome and related exercises.  Knee Pain Right knee pain, no clear etiology identified on examination. Facial Rash  Irritable Bowel Syndrome Previously diagnosed, currently managed with increased fiber and Linzess as needed.  Premature Graying Noted increase in gray hair, no thinning or scalp rash. -Continue monitoring, no intervention needed at this time.   Orders: Orders Placed This Encounter  Procedures   Sedimentation rate   C-reactive protein   Angiotensin converting enzyme   Meds ordered this encounter  Medications   triamcinolone cream (KENALOG) 0.1 %    Sig: Apply 1 Application topically 2 (two) times daily as needed. For skin    Dispense:  30 g    Refill:  0     Follow-Up Instructions: Return  in about 1 year (around 10/14/2024) for Cutaneous sarcoidosis f/u 50yr.   Fuller Plan, MD  Note - This record has been created using [employer].  Chart creation errors have been sought, but may not always  have been located. Such creation errors do not reflect on  the standard of medical care.

## 2023-10-17 LAB — C-REACTIVE PROTEIN: CRP: <3 mg/L (ref <8.0)

## 2023-10-17 LAB — SEDIMENTATION RATE: Sed Rate: 2 mm/h (ref 0–15)

## 2023-10-17 LAB — ANGIOTENSIN CONVERTING ENZYME: Angiotensin-Converting Enzyme: 53 U/L (ref 9–67)

## 2023-10-17 NOTE — Progress Notes (Signed)
Sedimentation rate remains normal at 2. CRP is normal at <3. His ACE level is normal so does not suggest active sarcoidosis. I think he is fine to continue just symptom treatment as needed no new medication recommended.

## 2024-03-21 ENCOUNTER — Encounter: Payer: Self-pay | Admitting: Family Medicine

## 2024-06-02 ENCOUNTER — Encounter: Payer: Medicaid Other | Admitting: Family Medicine

## 2024-06-03 ENCOUNTER — Encounter: Admitting: Family Medicine

## 2024-07-09 ENCOUNTER — Encounter: Admitting: Family Medicine

## 2024-08-19 ENCOUNTER — Encounter: Admitting: Family Medicine

## 2024-09-03 ENCOUNTER — Encounter: Admitting: Family Medicine

## 2024-09-03 NOTE — Progress Notes (Incomplete)
 Assessment  Assessment/Plan:  Assessment and Plan              There are no discontinued medications.  Patient Counseling(The following topics were reviewed and/or handout was given):  -Nutrition: Stressed importance of moderation in sodium/caffeine intake, saturated fat and cholesterol, caloric balance, sufficient intake of fresh fruits, vegetables, and fiber.  -Stressed the importance of regular exercise.   -Substance Abuse: Discussed cessation/primary prevention of tobacco, alcohol, or other drug use; driving or other dangerous activities under the influence; availability of treatment for abuse.   -Injury prevention: Discussed safety belts, safety helmets, smoke detector, smoking near bedding or upholstery.   -Sexuality: Discussed sexually transmitted diseases, partner selection, use of condoms, avoidance of unintended pregnancy and contraceptive alternatives.   -Dental health: Discussed importance of regular tooth brushing, flossing, and dental visits.  -Health maintenance and immunizations reviewed. Please refer to Health maintenance section.  No follow-ups on file.        Subjective:   Encounter date: 09/03/2024  No chief complaint on file.   Discussed the use of AI scribe software for clinical note transcription with the patient, who gave verbal consent to proceed.  History of Present Illness           GERD - Followed by gastroenterology, last seen in their office 11/10/2022. - Managed with omeprazole  40 mg daily.    Sarcoidosis - Followed by pulmonology.   Bilateral Knee Pain  - Managed with NSAIDs as needed.       05/29/2023    9:26 AM 02/27/2023    1:31 PM 05/18/2022    2:24 PM 02/07/2022    3:17 PM  Depression screen PHQ 2/9  Decreased Interest 0 0 0 0  Down, Depressed, Hopeless 0 0 0 0  PHQ - 2 Score 0 0 0 0  Altered sleeping 0  0   Tired, decreased energy 0  0   Change in appetite 0  0   Feeling bad or failure about yourself  0  0   Trouble  concentrating 0  0   Moving slowly or fidgety/restless 0  0   Suicidal thoughts 0  0   PHQ-9 Score 0   0    Difficult doing work/chores Not difficult at all  Not difficult at all      Data saved with a previous flowsheet row definition       05/29/2023    9:26 AM 05/18/2022    2:25 PM  GAD 7 : Generalized Anxiety Score  Nervous, Anxious, on Edge 0 0  Control/stop worrying 0 0  Worry too much - different things 0 0  Trouble relaxing 0 0  Restless 0 0  Easily annoyed or irritable 0 0  Afraid - awful might happen 0 0  Total GAD 7 Score 0 0  Anxiety Difficulty Not difficult at all Not difficult at all    Health Maintenance Due  Topic Date Due   DTaP/Tdap/Td (8 - Td or Tdap) 02/12/2021   Influenza Vaccine  Never done   COVID-19 Vaccine (3 - 2025-26 season) 05/26/2024     Dental: ***UTD Vision: ***UTD  PMH:  The following were reviewed and entered/updated in epic: Past Medical History:  Diagnosis Date   Abdominal pain, recurrent    GERD (gastroesophageal reflux disease)    IBS (irritable bowel syndrome)    Premature birth    Sarcoidosis    Subcutaneous   Vomiting     Patient Active Problem List  Diagnosis Date Noted   Piriformis syndrome of right side 10/15/2023   Strep pharyngitis 07/11/2023   Non-recurrent acute suppurative otitis media of right ear without spontaneous rupture of tympanic membrane 07/11/2023   Acute non-recurrent frontal sinusitis 07/11/2023   Patellofemoral arthralgia of both knees 10/12/2022   Constipation 08/15/2022   History of concussion 05/18/2022   History of migraine 05/18/2022   Malnutrition 05/18/2022   Gastroesophageal reflux disease without esophagitis 04/28/2019   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    Past Surgical History:  Procedure Laterality Date   WISDOM TOOTH EXTRACTION      Family History  Problem Relation Age of Onset   Hyperlipidemia Father    Hypertension Maternal Grandmother    Sarcoidosis Maternal  Grandmother    Diabetes Maternal Grandmother    Hyperlipidemia Maternal Grandmother    COPD Maternal Grandmother    Prostate cancer Maternal Grandfather    Neuropathy Maternal Grandfather    GER disease Paternal Grandmother    Hypertension Paternal Grandmother    Hyperlipidemia Paternal Grandmother    Thyroid disease Paternal Grandmother    Heart attack Paternal Grandfather    Colon cancer Neg Hx    Esophageal cancer Neg Hx    Rectal cancer Neg Hx    Stomach cancer Neg Hx     Medications- reviewed and updated Outpatient Medications Prior to Visit  Medication Sig Dispense Refill   acetaminophen  (TYLENOL ) 325 MG tablet Take by mouth as needed.     amoxicillin -clavulanate (AUGMENTIN ) 875-125 MG tablet Take 1 tablet by mouth 2 (two) times daily. (Patient not taking: Reported on 10/15/2023) 20 tablet 0   fluticasone  (FLONASE ) 50 MCG/ACT nasal spray Place 2 sprays into both nostrils daily. (Patient not taking: Reported on 10/15/2023) 16 g 6   ibuprofen  (ADVIL ) 200 MG tablet Take by mouth as needed.     omeprazole  (PRILOSEC) 40 MG capsule TAKE 1 CAPSULE (40 MG TOTAL) BY MOUTH DAILY. 90 capsule 1   Probiotic Product (PROBIOTIC DAILY PO) Take by mouth.     promethazine -dextromethorphan (PROMETHAZINE -DM) 6.25-15 MG/5ML syrup Take 5 mLs by mouth 4 (four) times daily as needed. (Patient not taking: Reported on 10/15/2023) 118 mL 0   triamcinolone  cream (KENALOG ) 0.1 % Apply 1 Application topically 2 (two) times daily as needed. For skin 30 g 0   No facility-administered medications prior to visit.     No Known Allergies  Social History   Socioeconomic History   Marital status: Single    Spouse name: Not on file   Number of children: Not on file   Years of education: Not on file   Highest education level: Bachelor's degree (e.g., BA, AB, BS)  Occupational History   Not on file  Tobacco Use   Smoking status: Never    Passive exposure: Never   Smokeless tobacco: Never  Vaping Use    Vaping status: Never Used  Substance and Sexual Activity   Alcohol use: Never   Drug use: Never   Sexual activity: Never  Other Topics Concern   Not on file  Social History Narrative   Not on file   Social Drivers of Health   Financial Resource Strain: Low Risk  (09/02/2024)   Overall Financial Resource Strain (CARDIA)    Difficulty of Paying Living Expenses: Not hard at all  Food Insecurity: No Food Insecurity (09/02/2024)   Hunger Vital Sign    Worried About Running Out of Food in the Last Year: Never true    Ran Out of Food in  the Last Year: Never true  Transportation Needs: No Transportation Needs (09/02/2024)   PRAPARE - Administrator, Civil Service (Medical): No    Lack of Transportation (Non-Medical): No  Physical Activity: Insufficiently Active (09/02/2024)   Exercise Vital Sign    Days of Exercise per Week: 3 days    Minutes of Exercise per Session: 30 min  Stress: No Stress Concern Present (09/02/2024)   [employer] of Occupational Health - Occupational Stress Questionnaire    Feeling of Stress: Only a little  Social Connections: Moderately Isolated (09/02/2024)   Social Connection and Isolation Panel    Frequency of Communication with Friends and Family: More than three times a week    Frequency of Social Gatherings with Friends and Family: More than three times a week    Attends Religious Services: More than 4 times per year    Active Member of [employer] or Organizations: No    Attends Engineer, Structural: Not on file    Marital Status: Never married           Objective:  Physical Exam: There were no vitals taken for this visit.  There is no height or weight on file to calculate BMI. Wt Readings from Last 3 Encounters:  10/15/23 98 lb (44.5 kg)  07/26/23 96 lb (43.5 kg)  07/09/23 99 lb 6.4 oz (45.1 kg)    Physical Exam          Physical Exam      Prior labs:   No results found for this or any previous visit (from the past  2160 hours).  Lab Results  Component Value Date   CHOL 166 05/25/2022   Lab Results  Component Value Date   HDL 54.50 05/25/2022   Lab Results  Component Value Date   LDLCALC 94 05/25/2022   Lab Results  Component Value Date   TRIG 87.0 05/25/2022   Lab Results  Component Value Date   CHOLHDL 3 05/25/2022   No results found for: LDLDIRECT  Last metabolic panel Lab Results  Component Value Date   GLUCOSE 90 05/29/2023   NA 138 05/29/2023   K 4.5 05/29/2023   CL 101 05/29/2023   CO2 29 05/29/2023   BUN 7 05/29/2023   CREATININE 0.83 05/29/2023   GFR 123.54 05/29/2023   CALCIUM 9.7 05/29/2023   PROT 6.9 05/29/2023   ALBUMIN 4.6 05/29/2023   BILITOT 1.1 05/29/2023   ALKPHOS 68 05/29/2023   AST 18 05/29/2023   ALT 9 05/29/2023    Lab Results  Component Value Date   HGBA1C 4.9 05/29/2023    Last CBC Lab Results  Component Value Date   WBC 4.9 05/29/2023   HGB 15.1 05/29/2023   HCT 43.7 05/29/2023   MCV 88.4 05/29/2023   MCH 28.8 06/19/2011   RDW 12.4 05/29/2023   PLT 210.0 05/29/2023    No results found for: TSH  No results found for: PSA1, PSA  Last vitamin D  No results found for: MARIEN BOLLS, VD25OH  Lab Results  Component Value Date   BILIRUBINUR NEGATIVE 05/29/2023   PROTEINUR NEG 06/19/2011   UROBILINOGEN 1.0 05/29/2023   LEUKOCYTESUR NEGATIVE 05/29/2023    No results found for: LABMICR, MICROALBUR   At today's visit, we discussed treatment options, associated risk and benefits, and engage in counseling as needed.  Additionally the following were reviewed: Past medical records, past medical and surgical history, family and social background, as well as relevant laboratory results, imaging findings,  and specialty notes, where applicable.  This message was generated using dictation software, and as a result, it may contain unintentional typos or errors.  Nevertheless, extensive effort was made to accurately  convey at the pertinent aspects of the patient visit.    There may have been are other unrelated non-urgent complaints, but due to the busy schedule and the amount of time already spent with him, time does not permit to address these issues at today's visit. Another appointment may have or has been requested to review these additional issues.     Beverley KATHEE Hummer, MD  I,Emily Lagle,acting as a scribe for Beverley KATHEE Hummer, MD.,have documented all relevant documentation on the behalf of Beverley KATHEE Hummer, MD.  LILLETTE Beverley KATHEE Hummer, MD, have reviewed all documentation for this visit. The documentation on 09/03/2024 for the exam, diagnosis, procedures, and orders are all accurate and complete.

## 2024-09-12 ENCOUNTER — Ambulatory Visit: Admitting: Family Medicine

## 2024-09-12 ENCOUNTER — Encounter: Payer: Self-pay | Admitting: Family Medicine

## 2024-09-12 VITALS — BP 114/68 | HR 81 | Temp 98.8°F | Ht 65.0 in | Wt 97.6 lb

## 2024-09-12 DIAGNOSIS — J069 Acute upper respiratory infection, unspecified: Secondary | ICD-10-CM | POA: Diagnosis not present

## 2024-09-12 LAB — POCT INFLUENZA A/B
Influenza A, POC: NEGATIVE
Influenza B, POC: NEGATIVE

## 2024-09-12 LAB — POC COVID19 BINAXNOW: SARS Coronavirus 2 Ag: NEGATIVE

## 2024-09-12 NOTE — Progress Notes (Signed)
 " [facility] [address] [place] [address] Dept: [phone] Dept Fax: [phone]  Office Visit  Subjective:    Patient ID: Daniel [name], male    DOB: 2000/11/19, 23 y.o..   MRN: [id number]  Chief Complaint  Patient presents with   Sore Throat    C/o having ST, runny nose, bilateral ear pain, cough, and fatigue x 4 days.  Has taken Dayquil and advil .     History of Present Illness:  Patient is in today complaining of a 4-day history of rhinorrhea, nasal congestion, sore throat, and cough productive of mucous. He has been taking Tylenol  and ibuprofen  for his symptoms. He denies any fever. His sister recently was diagnosed with a strep throat infection.   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   Past Surgical History:  Procedure Laterality Date   WISDOM TOOTH EXTRACTION     Family History  Problem Relation Age of Onset   Hyperlipidemia Father    Hypertension Maternal Grandmother    Sarcoidosis Maternal Grandmother    Diabetes Maternal Grandmother    Hyperlipidemia Maternal Grandmother    COPD Maternal Grandmother    Prostate cancer Maternal Grandfather    Neuropathy Maternal Grandfather    GER disease Paternal Grandmother    Hypertension Paternal Grandmother    Hyperlipidemia Paternal Grandmother    Thyroid disease Paternal Grandmother    Heart attack  Paternal Grandfather    Colon cancer Neg Hx    Esophageal cancer Neg Hx    Rectal cancer Neg Hx    Stomach cancer Neg Hx    Outpatient Medications Prior to Visit  Medication Sig Dispense Refill   acetaminophen  (TYLENOL ) 325 MG tablet Take by mouth as needed.     amoxicillin -clavulanate (AUGMENTIN ) 875-125 MG tablet Take 1 tablet by mouth 2 (two) times daily. (Patient not taking: Reported on 10/15/2023) 20 tablet 0   fluticasone  (FLONASE ) 50 MCG/ACT nasal spray Place 2 sprays into both nostrils daily. (Patient not taking: Reported on 10/15/2023) 16 g 6   ibuprofen  (ADVIL ) 200 MG tablet Take by mouth as needed.     omeprazole  (PRILOSEC) 40 MG capsule TAKE 1 CAPSULE (40 MG TOTAL) BY MOUTH DAILY. 90 capsule 1   Probiotic Product (PROBIOTIC DAILY PO) Take by mouth.     promethazine -dextromethorphan (PROMETHAZINE -DM) 6.25-15 MG/5ML syrup Take 5 mLs by mouth 4 (four) times daily as needed. (Patient not taking: Reported on 10/15/2023) 118 mL 0   triamcinolone  cream (KENALOG ) 0.1 % Apply 1 Application topically 2 (two) times daily as needed. For skin 30 g 0   No facility-administered medications prior to visit.   Allergies[1]   Objective:   Today's Vitals   09/12/24 1042  BP: 114/68  Pulse: 81  Temp: 98.8 F (37.1 C)  TempSrc: Temporal  SpO2: 98%  Weight: 97 lb 9.6 oz (44.3 kg)  Height: 5' 5 (1.651 m)   Body mass index is 16.24 kg/m.   General: Well developed, well nourished. No  acute distress. HEENT: Normocephalic, non-traumatic. PERRL, EOMI. Conjunctiva clear. External ears normal. EAC and TMs   normal bilaterally. Nose with mild congestion and rhinorrhea. Mucous membranes moist. Moderate cobblestoning of   the posterior oropharynx. Good dentition. Neck: Supple. No lymphadenopathy. No thyromegaly. Lungs: Clear to auscultation bilaterally. No wheezing, rales or rhonchi. Psych: Alert and oriented. Normal mood and affect.  Health Maintenance Due  Topic Date Due   DTaP/Tdap/Td (8 -  Td or Tdap) 02/12/2021   Influenza Vaccine  Never done   COVID-19 Vaccine (3 - 2025-26 season) 05/26/2024   Lab Results POCT Covid: Neg. POCT Influenza A& B: Neg.    Assessment & Plan:   Problem List Items Addressed This Visit       Respiratory   Viral URI with cough - Primary   Discussed home care for viral illness, including rest, pushing fluids, and OTC medications as needed for symptom relief. Recommend hot tea with honey for sore throat symptoms. Follow-up if needed for worsening or persistent symptoms.       Relevant Orders   POC COVID-19   POCT Influenza A/B    Return if symptoms worsen or fail to improve.    Garnette CHRISTELLA Simpler, MD  I,Emily Lagle,acting as a scribe for Garnette CHRISTELLA Simpler, MD.,have documented all relevant documentation on the behalf of Garnette CHRISTELLA Simpler, MD.  I, Garnette CHRISTELLA Simpler, MD, have reviewed all documentation for this visit. The documentation on 09/12/2024 for the exam, diagnosis, procedures, and orders are all accurate and complete.     [1] No Known Allergies  "

## 2024-09-12 NOTE — Assessment & Plan Note (Signed)
 Discussed home care for viral illness, including rest, pushing fluids, and OTC medications as needed for symptom relief. Recommend hot tea with honey for sore throat symptoms. Follow-up if needed for worsening or persistent symptoms.

## 2024-09-15 ENCOUNTER — Encounter: Payer: Self-pay | Admitting: Family Medicine

## 2024-09-16 ENCOUNTER — Ambulatory Visit: Admitting: Family Medicine

## 2024-09-16 ENCOUNTER — Encounter: Payer: Self-pay | Admitting: Family Medicine

## 2024-09-16 ENCOUNTER — Ambulatory Visit: Payer: Self-pay

## 2024-09-16 VITALS — BP 100/64 | HR 103 | Temp 102.3°F | Ht 65.0 in | Wt 97.6 lb

## 2024-09-16 DIAGNOSIS — R058 Other specified cough: Secondary | ICD-10-CM

## 2024-09-16 DIAGNOSIS — R6889 Other general symptoms and signs: Secondary | ICD-10-CM | POA: Diagnosis not present

## 2024-09-16 LAB — POCT INFLUENZA A/B
Influenza A, POC: NEGATIVE
Influenza B, POC: NEGATIVE

## 2024-09-16 LAB — POCT RAPID STREP A (OFFICE): Rapid Strep A Screen: NEGATIVE

## 2024-09-16 LAB — POC COVID19 BINAXNOW: SARS Coronavirus 2 Ag: NEGATIVE

## 2024-09-16 MED ORDER — PSEUDOEPHEDRINE-CODEINE-GG 30-10-100 MG/5ML PO SOLN: 10.0000 mL | Freq: Four times a day (QID) | ORAL | 0 refills | Status: DC | PRN

## 2024-09-16 MED ORDER — PREDNISONE 5 MG PO TABS: ORAL_TABLET | ORAL | 0 refills | Status: DC

## 2024-09-16 MED ORDER — AMOXICILLIN 875 MG PO TABS: 875.0000 mg | ORAL_TABLET | Freq: Two times a day (BID) | ORAL | 0 refills | Status: AC

## 2024-09-16 NOTE — Telephone Encounter (Signed)
 FYI Only or Action Required?: FYI only for provider: appointment scheduled on 09/16/2024.  Patient was last seen in primary care on 09/12/2024 by Thedora Garnette HERO, MD.  Called Nurse Triage reporting Cough.  Symptoms began several days ago.  Interventions attempted: OTC medications: Mucinex.  Symptoms are: gradually worsening.  Triage Disposition: See Physician Within 24 Hours  Patient/caregiver understands and will follow disposition?: Yes     Message from [facility] S sent at 09/16/2024  9:47 AM EST  Reason for Triage: cold is getting worse, pt has not gotten sleep because he is coughing a lot and would like recommendations on what else to take or get prescribed. Please call soon as possible  [phone]   Reason for Disposition  SEVERE coughing spells (e.g., whooping sound after coughing, vomiting after coughing)  Answer Assessment - Initial Assessment Questions This RN scheduled pt an appointment today in office. This RN educated pt on home care, new-worsening symptoms, when to call back/seek emergent care. Pt verbalized understanding and agrees to plan.  Onset: Sunday  Symptoms: Productive cough, worsened since yesterday, causing pt not to sleep Chest pain with cough  Denies:  Difficulty breathing Fever  Protocols used: Cough - Acute Productive-A-AH

## 2024-09-16 NOTE — Telephone Encounter (Signed)
 Noted. Dm/cma

## 2024-09-16 NOTE — Progress Notes (Signed)
 "  The patient is being seen today for an acute visit.  Patient Care Team: Daniel Beverley NOVAK, MD as PCP - General (Family Medicine)   Subjective:   Chief Complaint  Patient presents with   [name]    Pt is here today to be seen for a lingering [name]. He was seen by Dr Daniel [name] on 09/12/2024. He has been treating with OTC medications like mucinex. He mentions that when he coughs his head hurts.     Review of Systems: Negative, with the exception of above mentioned in HPI.  History:   Reviewed by clinician on day of visit: allergies, medications, problem list, medical history, surgical history, family history, social history, and previous encounter notes.  Medications:   Active Medications[1] Allergies[2]  Objective:   BP 100/64 (BP Location: Right Arm, Cuff Size: Normal)   Pulse (!) 103   Temp (!) 102.3 F (39.1 C) (Oral)   Ht 5' 5 (1.651 m)   Wt 97 lb 9.6 oz (44.3 kg)   SpO2 97%   BMI 16.24 kg/m   Physical Exam Constitutional:      Appearance: He is ill-appearing.  HENT:     Nose: Congestion and rhinorrhea present.  Eyes:     Conjunctiva/sclera: Conjunctivae normal.  Cardiovascular:     Heart sounds: Normal heart sounds.  Pulmonary:     Breath sounds: Rhonchi present.  Musculoskeletal:     Right lower leg: No edema.     Left lower leg: No edema.  Skin:    Findings: No rash.  Neurological:     General: No focal deficit present.     Mental Status: He is alert.       Assessment & Plan:   Daniel [name].  Diagnoses and all orders for this visit:  Flu-like symptoms -     POC COVID-19 BinaxNow -     POCT rapid strep A -     POCT Influenza A/B  Productive [name] Patient presents with persistent [name] and lower respiratory symptoms, negative viral testing (including COVID, influenza). Given symptom burden, duration, and clinical concern for possible bacterial pneumonia, empiric treatment warranted.  Treatment Empiric antibiotics initiated  for suspected community-acquired pneumonia given clinical presentation (see medication orders). Supportive care advised including adequate hydration, rest, humidified air, and OTC antitussives/expectorants as tolerated. Acetaminophen  or ibuprofen  as needed for fever or discomfort. Cheratussin not available at pharmacy. Prescription changed to Tussionex (hydrocodone/chlorpheniramine) for nighttime [name] suppression.  Monitoring / Return Precautions Reviewed red flags including worsening shortness of breath, persistent or high fever, chest pain, hypoxia, dizziness, or inability to tolerate oral intake. Patient instructed to seek urgent or emergency care if symptoms acutely worsen.  Follow-up Reassess clinically within 48-72 hours if no improvement, sooner if worsening. Will review chest imaging results and adjust treatment as indicated.  Daniel Shutter, DO, MS, FAAFP, Dipl. [name] [address] [place], [address] Dept: [phone] Dept Fax: [phone]     [1]  Current Meds  Medication Sig   acetaminophen  (TYLENOL ) 325 MG tablet Take by mouth as needed.   amoxicillin  (AMOXIL ) 875 MG tablet Take 1 tablet (875 mg total) by mouth 2 (two) times daily for 10 days.   fluticasone  (FLONASE ) 50 MCG/ACT nasal spray Place 2 sprays into both nostrils daily.   ibuprofen  (ADVIL ) 200 MG tablet Take by mouth as needed.   omeprazole  (PRILOSEC) 40 MG capsule TAKE 1 CAPSULE (40 MG TOTAL) BY MOUTH DAILY.  predniSONE  (DELTASONE ) 5 MG tablet 6-5-4-3-2-1-off   Probiotic Product (PROBIOTIC DAILY PO) Take by mouth.   triamcinolone  cream (KENALOG ) 0.1 % Apply 1 Application topically 2 (two) times daily as needed. For skin   [DISCONTINUED] pseudoephedrine -codeine -guaifenesin (MYTUSSIN DAC) 30-10-100 MG/5ML solution Take 10 mLs by mouth 4 (four) times daily as needed for [name].  [2] No Known Allergies  "

## 2024-09-17 NOTE — Telephone Encounter (Signed)
 Copied from CRM #[id number]. Topic: Clinical - Prescription Issue >> Sep 17, 2024  9:19 AM Franky GRADE wrote: Reason for CRM: Patient is calling because pseudoephedrine -codeine -guaifenesin (MYTUSSIN DAC) 30-10-100 MG/5ML solution [[id number]] was prescribed yesterday; however, the pharmacy informed patient they don't carry that medication and asked to send an alternative.

## 2024-09-17 NOTE — Telephone Encounter (Signed)
 Copied from CRM #[id number]. Topic: Clinical - Medication Question >> Sep 16, 2024  5:08 PM Drema MATSU wrote: Reason for CRM: Pharmacy stated that Guaifenefin Dac Oral Solution needs to be updated and thery are needing a new prescription for it.

## 2024-09-18 MED ORDER — HYDROCOD POLI-CHLORPHE POLI ER 10-8 MG/5ML PO SUER: 5.0000 mL | Freq: Two times a day (BID) | ORAL | 0 refills | Status: DC | PRN

## 2024-09-19 ENCOUNTER — Ambulatory Visit (INDEPENDENT_AMBULATORY_CARE_PROVIDER_SITE_OTHER): Admitting: Family Medicine

## 2024-09-19 ENCOUNTER — Encounter: Payer: Self-pay | Admitting: Family Medicine

## 2024-09-19 ENCOUNTER — Ambulatory Visit: Payer: Self-pay

## 2024-09-19 ENCOUNTER — Ambulatory Visit
Admission: RE | Admit: 2024-09-19 | Discharge: 2024-09-19 | Disposition: A | Discharge status: Home / Self Care | Source: Ambulatory Visit | Attending: Family Medicine | Admitting: Family Medicine

## 2024-09-19 VITALS — BP 113/79 | HR 100 | Temp 98.8°F | Ht 65.0 in | Wt 96.4 lb

## 2024-09-19 DIAGNOSIS — R509 Fever, unspecified: Secondary | ICD-10-CM | POA: Diagnosis not present

## 2024-09-19 DIAGNOSIS — J180 Bronchopneumonia, unspecified organism: Secondary | ICD-10-CM | POA: Diagnosis not present

## 2024-09-19 DIAGNOSIS — R0781 Pleurodynia: Secondary | ICD-10-CM

## 2024-09-19 DIAGNOSIS — J019 Acute sinusitis, unspecified: Secondary | ICD-10-CM

## 2024-09-19 LAB — CBC WITH DIFFERENTIAL/PLATELET
Absolute Lymphocytes: 1814 {cells}/uL (ref 850–3900)
Absolute Monocytes: 569 {cells}/uL (ref 200–950)
Basophils Absolute: 9 {cells}/uL (ref 0–200)
Basophils Relative: 0.2 %
Eosinophils Absolute: 0 {cells}/uL — ABNORMAL LOW (ref 15–500)
Eosinophils Relative: 0 %
HCT: 44.2 % (ref 39.4–51.1)
Hemoglobin: 15.7 g/dL (ref 13.2–17.1)
MCH: 31.5 pg (ref 27.0–33.0)
MCHC: 35.5 g/dL — ABNORMAL HIGH (ref 31.6–35.4)
MCV: 88.6 fL (ref 81.4–101.7)
MPV: 10.1 fL (ref 7.5–12.5)
Monocytes Relative: 12.1 %
Neutro Abs: 2308 {cells}/uL (ref 1500–7800)
Neutrophils Relative %: 49.1 %
Platelets: 237 Thousand/uL (ref 140–400)
RBC: 4.99 Million/uL (ref 4.20–5.80)
RDW: 12.2 % (ref 11.0–15.0)
Total Lymphocyte: 38.6 %
WBC: 4.7 Thousand/uL (ref 3.8–10.8)

## 2024-09-19 NOTE — Telephone Encounter (Signed)
 Copied from CRM #[id number]. Topic: Clinical - Medication Question >> Sep 17, 2024 11:12 AM Rea BROCKS wrote: Reason for CRM:  pseudoephedrine -codeine -guaifenesin (MYTUSSIN DAC) 30-10-100 MG/5ML solution. The medication was prescribed during his visit and [facility] didn't have it and asked for an alternative.   Patient is calling in to follow up on request for cough syrup medication  [phone] (M) >> Sep 17, 2024 12:54 PM Viola F wrote: Patient called to follow up on this - he's extremely upset because he could not get his cough syrup

## 2024-09-19 NOTE — Telephone Encounter (Signed)
 Seen 12/23 for flu like symptoms Amox, prednisone , cough medicine Cough/congestion/still fever --> take tylenol   FYI Only or Action Required?: FYI only for provider: appointment scheduled on 12/26.  Patient was last seen in primary care on 09/16/2024 by Prentiss Frieze, DO.  Called Nurse Triage reporting URI.  Symptoms began a week ago.  Interventions attempted: Prescription medications: amoxicillin , prednisone , cough medicine.  Symptoms are: unchanged.  Triage Disposition: See Physician Within 24 Hours  Patient/caregiver understands and will follow disposition?: Yes   Copied from CRM #[id number]. Topic: Clinical - Red Word Triage >> Sep 19, 2024 11:16 AM Alfonso HERO wrote: Red Word that prompted transfer to Nurse Triage: chest and left side pain gets worse when coughing Reason for Disposition  [1] Continuous (nonstop) coughing interferes with work or school AND [2] no improvement using cough treatment per Care Advice  Answer Assessment - Initial Assessment Questions 1. ONSET: When did the cough begin?      About a week ago per pt; currently taking prednisone  and amoxicillin   2. SEVERITY: How bad is the cough today?      Pretty bad   3. SPUTUM: Describe the color of your sputum (e.g., none, dry cough; clear, white, yellow, green)     White mucousy  4. HEMOPTYSIS: Are you coughing up any blood? If Yes, ask: How much? (e.g., flecks, streaks, tablespoons, etc.)     Denies  5. DIFFICULTY BREATHING: Are you having difficulty breathing? If Yes, ask: How bad is it? (e.g., mild, moderate, severe)      Denies, but reports back and chest hurt when he coughs  6. FEVER: Do you have a fever? If Yes, ask: What is your temperature, how was it measured, and when did it start?     101F yesterday; denies fever since; last took Tylenol  at 0500 today  7. CARDIAC HISTORY: Do you have any history of heart disease? (e.g., heart attack, congestive heart failure)       Denies  8. LUNG HISTORY: Do you have any history of lung disease?  (e.g., pulmonary embolus, asthma, emphysema)     Denies  10. OTHER SYMPTOMS: Do you have any other symptoms? (e.g., runny nose, wheezing, chest pain)       Initially wheezing earlier this week but it has since resolved  Protocols used: Cough - Acute Productive-A-AH

## 2024-09-19 NOTE — Telephone Encounter (Signed)
 Please review and advise on message.  Thanks. Dm/cma

## 2024-09-19 NOTE — Progress Notes (Signed)
 OFFICE VISIT  09/19/2024  CC:  Chief Complaint  Patient presents with   Follow-up    URI; cough started 1 wk ago; chest and left side pain worsening with cough; currently taking Amoxicillin  and Prednisone ; body aches and chills, runny nose    Patient is a 23 y.o. male who presents for cough.  HPI: Onset of symptoms 10 to 12 days ago with nasal congestion, sore throat, ear pain, cough, and fatigue. He was seen 09/12/2024 as well as 09/16/2024 for same concern. COVID and flu tests at each of these visits was negative. Additionally, rapid strep was NEG 09/16/2024. He was prescribed amoxicillin  x 10 days, prednisone  5 mg Dosepak, and tussionex cough syrup on 09/16/2024. Having more pain in the chest when coughing or taking deep breath.  The Tussionex helps his nighttime cough and he rests better. Mucinex DM in the daytime does help him expectorate more mucus. He did have a fever to 102 last night/this morning.  He does describe a double sickening type of clinical course.  Review of systems: No nausea or vomiting, no rash, no eye redness or drainage, no joint swelling.  Past Medical History:  Diagnosis Date   Abdominal pain, recurrent    GERD (gastroesophageal reflux disease)    IBS (irritable bowel syndrome)    Premature birth    Sarcoidosis    Subcutaneous   Vomiting     Past Surgical History:  Procedure Laterality Date   WISDOM TOOTH EXTRACTION      Outpatient Medications Prior to Visit  Medication Sig Dispense Refill   acetaminophen  (TYLENOL ) 325 MG tablet Take by mouth as needed.     amoxicillin  (AMOXIL ) 875 MG tablet Take 1 tablet (875 mg total) by mouth 2 (two) times daily for 10 days. 20 tablet 0   chlorpheniramine-HYDROcodone (TUSSIONEX) 10-8 MG/5ML Take 5 mLs by mouth every 12 (twelve) hours as needed for cough. 115 mL 0   fluticasone  (FLONASE ) 50 MCG/ACT nasal spray Place 2 sprays into both nostrils daily. 16 g 6   ibuprofen  (ADVIL ) 200 MG tablet Take by mouth  as needed.     omeprazole  (PRILOSEC) 40 MG capsule TAKE 1 CAPSULE (40 MG TOTAL) BY MOUTH DAILY. 90 capsule 1   predniSONE  (DELTASONE ) 5 MG tablet 6-5-4-3-2-1-off 21 tablet 0   Probiotic Product (PROBIOTIC DAILY PO) Take by mouth.     triamcinolone  cream (KENALOG ) 0.1 % Apply 1 Application topically 2 (two) times daily as needed. For skin 30 g 0   No facility-administered medications prior to visit.    Allergies[1]  Review of Systems  As per HPI  PE:    09/19/2024    3:01 PM 09/16/2024    4:05 PM 09/12/2024   10:42 AM  Vitals with BMI  Height 5' 5 5' 5 5' 5  Weight 96 lbs 6 oz 97 lbs 10 oz 97 lbs 10 oz  BMI 16.04 16.24 16.24  Systolic 113 100 885  Diastolic 79 64 68  Pulse 100 103 81   02 sat 98% RA  Physical Exam  Gen: Alert, well appearing.  Patient is oriented to person, place, time, and situation. ENT: Ears: EACs clear, normal epithelium.  TMs with good light reflex and landmarks bilaterally.  Eyes: no injection, icteris, swelling, or exudate.  EOMI, PERRLA. Nose: no drainage or turbinate edema/swelling.  No injection or focal lesion.  Mouth: lips without lesion/swelling.  Oral mucosa pink and moist.  Dentition intact and without obvious caries or gingival swelling.  Oropharynx without  erythema, exudate, or swelling.  Neck: no adenopathy CV: Regular rhythm, tachycardia to 105, no murmur/r/g.   LUNGS: CTA bilat, nonlabored resps, good aeration in all lung fields. Expiratory phase is not prolonged.  He does not have excessive cough after exhalation.  LABS:  none  IMPRESSION AND PLAN:  Prolonged URI with progressive cough. He has acute bronchopneumonia.  He has just started antibiotics and steroids.  No new medications today (continue amoxicillin , prednisone , Mucinex DM in daytime, and tussionexin the nighttime).   Will check chest x-ray today to rule out any sign of myocarditis or significant infiltrate or effusion. Check CBC with differential today as well.  An  After Visit Summary was printed and given to the patient.  FOLLOW UP: Return if symptoms worsen or fail to improve.  Signed:  Gerlene Hockey, MD           09/19/2024     [1] No Known Allergies

## 2024-09-21 ENCOUNTER — Ambulatory Visit: Payer: Self-pay | Admitting: Family Medicine

## 2024-09-24 ENCOUNTER — Encounter: Payer: Self-pay | Admitting: Family Medicine

## 2024-10-01 NOTE — Progress Notes (Signed)
 "  Office Visit Note  Patient: Daniel [name]             Date of Birth: November 21, 2000           MRN: [id number]             PCP: Sebastian Beverley NOVAK, MD Referring: Sebastian Beverley NOVAK, MD Visit Date: 10/14/2024   Subjective:  No chief complaint on file.   History of Present Illness: Daniel [name] is a 24 y.o. male here for follow up for cutaneous sarcoidosis currently under observation after visit last year.   Discussed the use of AI scribe software for clinical note transcription with the patient, who gave verbal consent to proceed.  History of Present Illness   Daniel [name] is a 24 year old male with sarcoidosis who presents with persistent facial rashes.  He has persistent facial rashes that have been ongoing since his last visit a year ago. The rashes, initially isolated around his nose, have occasionally spread towards the outside of his face, particularly worsening in the winter. He applies triamcinolone  cream as needed, which provides some relief. No new rashes have appeared elsewhere on his body.  Last month, he experienced a respiratory illness characterized by a bad cough, for which he was treated with antibiotics and steroids. A chest x-ray performed at that time was normal. He recalls a previous episode of strep throat earlier last year, which was treated with medication. He mentions occasional chest pain, specifically localized between the ribs, which began during his recent illness. He takes ibuprofen  for relief.  He reports intermittent sciatic nerve pain, particularly affecting his hip, which was worse last month but has since improved. The pain can wake him at night when severe.  He has a history of IBS, which has improved with the use of probiotics, though he occasionally experiences flare-ups. Despite eating regularly, he finds it difficult to gain weight.       Previous HPI 10/15/2023 Daniel [name] is a 24 y.o. male here for follow up for cutaneous sarcoidosis  currently under observation after visit last year. The patient also reports intermittent facial rashes, specifically around the nose. The rashes, described as dark and slightly raised, occur approximately once a month and last for a day or two.   He also reports intermittent right knee pain and bilateral hand tremors. They report that the knee pain is localized to the right side and has been ongoing. The tremors, described as shaking, occur during activities that involve holding objects. The patient denies any associated functional impairment such as unintentional dropping of items. They report a family history of essential tremors in extended family members and an elderly grandparent.   Additionally, the patient has been experiencing numbness in their legs, but denies any carpal tunnel-like symptoms in their hands. They also report a history of multiple ear infections and strep throat, but have not required any specialized ENT care.   The patient has a history of irritable bowel syndrome (IBS), which previously caused constipation for a month. They have tried increasing fiber intake and using stool softeners without significant improvement. They were prescribed Linzess , but report not taking it regularly due to its disruptive effects on their day.   Lastly, the patient has noticed an increase in the amount of gray hair, but denies any hair thinning or scalp rashes. They report no significant weight changes and deny any issues with cold weather tolerance.    Previous HPI 10/12/22 Daniel [name]  is a 24 y.o. 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 He was diagnosed age 74 with skin biopsy for facial lesion with rash along the borders of his nose on both sides. Initial treatment with topical steroids for these rashes. He was never on long term DMARD treatment for this condition and no known systemic disease. No recent skin problems besides residual hyperpigmented spots and he also notices few white discolored  spots on his torso and arms. He has eye pain and headaches intermittent most often  on the left side with no inflammatory findings noted with his eye exam including slit lamp evaluation. He does not complain of significant dryness. He does have some seasonal or environmental allergies with rhinitis but takes medication only as needed for symptoms. He previously saw rheumatology with monitoring including serum inflammatory markers and chest xray normal monitoring from 2016 in pediatric rheumatology clinic then later with [facility] N[facility]. Most recent checked in November with PCP office. He is active playing sports without difficulty but occasionally feels chest tightness at other times such as vacuuming his home. This is not associated with chest pain or cough. He gets joint pain intermittently most often at his wrists and knees. Pain is usually after increased physical activity such as playing basketball. There has been some knee swelling in the past but usually pain is not associated with visible changes. He did have a fall with rib and medial knee pain in the past but no fracture and no surgical intervention required. His other active problem is chronic GI issue with constipation predominant IBS and GERD with unintentional weight loss and difficulty gaining weight. He had GI evaluation for this including a normal colonoscopy. Linzess  has been very help for IBS symptoms. He denies any trouble with swallowing or choking on food or liquids.     Review of Systems  Constitutional:  Negative for fatigue.  HENT:  Negative for mouth sores and mouth dryness.   Eyes:  Negative for dryness.  Respiratory:  Positive for shortness of breath.   Cardiovascular:  Positive for chest pain. Negative for palpitations.  Gastrointestinal:  Positive for constipation. Negative for blood in stool and diarrhea.  Endocrine: Negative for increased urination.  Genitourinary:  Negative for involuntary urination.   Musculoskeletal:  Positive for joint pain, joint pain and morning stiffness. Negative for gait problem, joint swelling, myalgias, muscle weakness, muscle tenderness and myalgias.  Skin:  Negative for color change, rash, hair loss and sensitivity to sunlight.  Allergic/Immunologic: Positive for susceptible to infections.  Neurological:  Positive for headaches. Negative for dizziness.  Hematological:  Negative for swollen glands.  Psychiatric/Behavioral:  Positive for sleep disturbance. Negative for depressed mood. The patient is not nervous/anxious.     PMFS History:  Patient Active Problem List   Diagnosis Date Noted   Viral URI with cough 09/12/2024   Piriformis syndrome of right side 10/15/2023   Strep pharyngitis 07/11/2023   Non-recurrent acute suppurative otitis media of right ear without spontaneous rupture of tympanic membrane 07/11/2023   Acute non-recurrent frontal sinusitis 07/11/2023   Patellofemoral arthralgia of both knees 10/12/2022   Constipation 08/15/2022   History of concussion 05/18/2022   History of migraine 05/18/2022   Malnutrition 05/18/2022   Gastroesophageal reflux disease without esophagitis 04/28/2019   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    Past Medical History:  Diagnosis Date   Abdominal pain, recurrent    GERD (gastroesophageal reflux disease)    IBS (irritable bowel syndrome)    Premature birth  Sarcoidosis    Subcutaneous   Vomiting     Family History  Problem Relation Age of Onset   Hyperlipidemia Father    Hypertension Maternal Grandmother    Sarcoidosis Maternal Grandmother    Diabetes Maternal Grandmother    Hyperlipidemia Maternal Grandmother    COPD Maternal Grandmother    Asthma Maternal Grandmother    Prostate cancer Maternal Grandfather    Neuropathy Maternal Grandfather    Cancer Maternal Grandfather    GER disease Paternal Grandmother    Hypertension Paternal Grandmother    Hyperlipidemia Paternal Grandmother     Thyroid disease Paternal Grandmother    Heart attack Paternal Grandfather    Heart disease Paternal Grandfather    Colon cancer Neg Hx    Esophageal cancer Neg Hx    Rectal cancer Neg Hx    Stomach cancer Neg Hx    Past Surgical History:  Procedure Laterality Date   WISDOM TOOTH EXTRACTION     Social History   Social History Narrative   Not on file   Immunization History  Administered Date(s) Administered   DTaP 04/10/2000, 06/12/2000, 08/09/2000, 05/29/2001, 07/05/2004   HIB (PRP-OMP) 04/10/2000, 06/12/2000, 08/09/2000, 05/29/2001   HPV 9-valent 02/13/2011, 02/15/2012, 02/14/2013   Hepatitis A 02/25/2009, 02/08/2010   Hepatitis B 04/10/2000, 06/12/2000, 11/06/2000   IPV 04/10/2000, 06/12/2000, 11/06/2000, 07/05/2004   MMR 02/06/2001, 07/05/2004   MenQuadfi_Meningococcal Groups ACYW Conjugate 02/13/2011, 02/08/2017   Meningococcal B, OMV 02/08/2017, 02/12/2018   PFIZER(Purple Top)SARS-COV-2 Vaccination 04/12/2020, 05/04/2020   Pneumococcal Conjugate PCV 7 08/09/2000, 11/06/2000, 02/06/2001   Td 02/13/2011   Tdap 02/13/2011   Varicella 02/06/2001, 07/04/2005     Objective: Vital Signs: BP 94/64   Pulse 78   Temp 98.5 F (36.9 C)   Resp 16   Ht 5' 5 (1.651 m)   Wt 97 lb 12 oz (44.3 kg)   BMI 16.27 kg/m    Physical Exam Eyes:     Conjunctiva/sclera: Conjunctivae normal.  Cardiovascular:     Rate and Rhythm: Normal rate and regular rhythm.  Pulmonary:     Effort: Pulmonary effort is normal.     Breath sounds: Normal breath sounds.  Lymphadenopathy:     Cervical: No cervical adenopathy.  Skin:    General: Skin is warm and dry.     Comments: Few scattered 1-66mm diameter hypopigmented spots on right lower chest and upper abdomen  Neurological:     Mental Status: He is alert.  Psychiatric:        Mood and Affect: Mood normal.      Musculoskeletal Exam:  Shoulders full ROM no tenderness or swelling Chest wall tenderness to pressure on left side  midclavicular line between about T2-T6 area, no palpable nodule or swelling Elbows full ROM no tenderness or swelling Wrists full ROM no tenderness or swelling Fingers full ROM no tenderness or swelling Right lateral hip and gluteal tenderness to pressure worse at posterior side of greater trochanter, normal hip rotation ROM without pain Knees full ROM no tenderness or swelling Ankles full ROM no tenderness or swelling   Investigation: No additional findings.  Imaging: DG Chest 2 View Result Date: 09/20/2024 CLINICAL DATA:  Cough for 2 weeks. History of cutaneous sarcoidosis. EXAM: CHEST - 2 VIEW COMPARISON:  08/16/2019. FINDINGS: The cardiomediastinal contours are stable. The lungs are clear. Pulmonary vasculature is normal. No consolidation, pleural effusion, or pneumothorax. No acute osseous abnormalities are seen. IMPRESSION: No active cardiopulmonary disease. Electronically Signed   By: Andrea Marlee HERO.D.  On: 09/20/2024 16:26    Recent Labs: Lab Results  Component Value Date   WBC 4.7 09/19/2024   HGB 15.7 09/19/2024   PLT 237 09/19/2024   NA 138 05/29/2023   K 4.5 05/29/2023   CL 101 05/29/2023   CO2 29 05/29/2023   GLUCOSE 90 05/29/2023   BUN 7 05/29/2023   CREATININE 0.83 05/29/2023   BILITOT 1.1 05/29/2023   ALKPHOS 68 05/29/2023   AST 18 05/29/2023   ALT 9 05/29/2023   PROT 6.9 05/29/2023   ALBUMIN 4.6 05/29/2023   CALCIUM 9.7 05/29/2023    Speciality Comments: No specialty comments available.  Procedures:  No procedures performed Allergies: Patient has no known allergies.   Assessment / Plan:     Visit Diagnoses: Cutaneous sarcoidosis (HCC) - Plan: triamcinolone  cream (KENALOG ) 0.1 % Intermittent facial rashes, primarily around the nose, with occasional spread. Symptoms more pronounced in winter. Current management with triamcinolone  cream is effective. He points out a few discolored area on the torso today which are apparently new, did not examine or  document regarding these last time. I think this is a separate process from his facial rashes which appear completely different. Differential diagnosis for chest rash includes tinea versicolor, vitiligo, and guttate hypomelanosis. - Sent prescription refill for triamcinolone  cream. - Monitor for changes in rash appearance or size.  Piriformis syndrome of right side Intermittent right-sided hip pain, exacerbated by sitting. Pain localized around the sciatic notch, likely due to muscle tightness, maybe some instability given knee patellofemoral symptoms, and lack of cushioning from lean body composition.  Viral URI with cough Intercostal muscle strain Intermittent chest pain localized between ribs, likely due to intercostal muscle strain from recent coughing episodes. Pain expected to resolve over several more weeks weeks post-infection. Pain reproducible on physical exam very reassuring against serious pathology. - Use ibuprofen  or Aleve for pain management as needed.      Mild protein-calorie malnutrition He expressed frustration with lack of weight gain despite trying to eat well. Denies any diarrhea, mucus, blood or other indications for malabsorption. On probiotic regularly. Tends to constipation and abdominal pain if overeating pas satiety though.   Orders: No orders of the defined types were placed in this encounter.  Meds ordered this encounter  Medications   triamcinolone  cream (KENALOG ) 0.1 %    Sig: Apply 1 Application topically 2 (two) times daily as needed.    Dispense:  60 g    Refill:  0     Follow-Up Instructions: Return in about 1 year (around 10/14/2025) for Cutaneous sarcoidosis f/u 33yr.   Lonni LELON Ester, MD  Note - This record has been created using [employer].  Chart creation errors have been sought, but may not always  have been located. Such creation errors do not reflect on  the standard of medical care. "

## 2024-10-14 ENCOUNTER — Ambulatory Visit: Payer: Medicaid Other | Attending: Internal Medicine | Admitting: Internal Medicine

## 2024-10-14 ENCOUNTER — Encounter: Payer: Self-pay | Admitting: Internal Medicine

## 2024-10-14 VITALS — BP 94/64 | HR 78 | Temp 98.5°F | Resp 16 | Ht 65.0 in | Wt 97.8 lb

## 2024-10-14 DIAGNOSIS — J069 Acute upper respiratory infection, unspecified: Secondary | ICD-10-CM | POA: Diagnosis not present

## 2024-10-14 DIAGNOSIS — G5701 Lesion of sciatic nerve, right lower limb: Secondary | ICD-10-CM | POA: Diagnosis not present

## 2024-10-14 DIAGNOSIS — E441 Mild protein-calorie malnutrition: Secondary | ICD-10-CM | POA: Diagnosis not present

## 2024-10-14 DIAGNOSIS — D863 Sarcoidosis of skin: Secondary | ICD-10-CM | POA: Insufficient documentation

## 2024-10-14 MED ORDER — TRIAMCINOLONE ACETONIDE 0.1 % EX CREA: 1.0000 | TOPICAL_CREAM | Freq: Two times a day (BID) | CUTANEOUS | 0 refills | Status: AC | PRN

## 2024-10-14 NOTE — Patient Instructions (Addendum)
 Your light colored skin spots may be due to idiopathic guttate hypomelanosis which is a benign and self-limiting condition. Other mimics could be tinea versicolor, which is due to inflammation from a skin fungal infection, or vitiligo which is an autoimmune process. These would tend to progress or enlarge over time.  Your chest wall pain is probably related to some costochondritis or intercostal muscle strain associated with a recent respiratory infection. You can try taking ibuprofen  or aleve for inflammation as needed.

## 2024-10-24 ENCOUNTER — Ambulatory Visit: Admitting: Family Medicine

## 2024-10-24 VITALS — BP 117/85 | HR 77 | Temp 97.9°F | Ht 65.0 in | Wt 97.2 lb

## 2024-10-24 DIAGNOSIS — E441 Mild protein-calorie malnutrition: Secondary | ICD-10-CM | POA: Diagnosis not present

## 2024-10-24 DIAGNOSIS — K219 Gastro-esophageal reflux disease without esophagitis: Secondary | ICD-10-CM

## 2024-10-24 DIAGNOSIS — Z0000 Encounter for general adult medical examination without abnormal findings: Secondary | ICD-10-CM | POA: Diagnosis not present

## 2024-10-24 DIAGNOSIS — M5441 Lumbago with sciatica, right side: Secondary | ICD-10-CM

## 2024-10-24 DIAGNOSIS — Z23 Encounter for immunization: Secondary | ICD-10-CM | POA: Diagnosis not present

## 2024-10-24 DIAGNOSIS — D863 Sarcoidosis of skin: Secondary | ICD-10-CM

## 2024-10-24 DIAGNOSIS — M5442 Lumbago with sciatica, left side: Secondary | ICD-10-CM

## 2024-10-24 DIAGNOSIS — K582 Mixed irritable bowel syndrome: Secondary | ICD-10-CM | POA: Diagnosis not present

## 2024-10-24 DIAGNOSIS — G8929 Other chronic pain: Secondary | ICD-10-CM | POA: Diagnosis not present

## 2024-10-24 LAB — CBC WITH DIFFERENTIAL/PLATELET
Basophils Absolute: 0 10*3/uL (ref 0.0–0.1)
Basophils Relative: 0.6 % (ref 0.0–3.0)
Eosinophils Absolute: 0 10*3/uL (ref 0.0–0.7)
Eosinophils Relative: 0.7 % (ref 0.0–5.0)
HCT: 46.1 % (ref 39.0–52.0)
Hemoglobin: 16.2 g/dL (ref 13.0–17.0)
Lymphocytes Relative: 46.2 % — ABNORMAL HIGH (ref 12.0–46.0)
Lymphs Abs: 1.5 10*3/uL (ref 0.7–4.0)
MCHC: 35.2 g/dL (ref 30.0–36.0)
MCV: 89 fl (ref 78.0–100.0)
Monocytes Absolute: 0.2 10*3/uL (ref 0.1–1.0)
Monocytes Relative: 6.8 % (ref 3.0–12.0)
Neutro Abs: 1.5 10*3/uL (ref 1.4–7.7)
Neutrophils Relative %: 45.7 % (ref 43.0–77.0)
Platelets: 245 10*3/uL (ref 150.0–400.0)
RBC: 5.18 Mil/uL (ref 4.22–5.81)
RDW: 12.8 % (ref 11.5–15.5)
WBC: 3.3 10*3/uL — ABNORMAL LOW (ref 4.0–10.5)

## 2024-10-24 LAB — HEMOGLOBIN A1C: Hgb A1c MFr Bld: 4.6 % (ref 4.6–6.5)

## 2024-10-24 LAB — COMPREHENSIVE METABOLIC PANEL WITH GFR
ALT: 18 U/L (ref 3–53)
AST: 30 U/L (ref 5–37)
Albumin: 5.3 g/dL — ABNORMAL HIGH (ref 3.5–5.2)
Alkaline Phosphatase: 92 U/L (ref 39–117)
BUN: 6 mg/dL (ref 6–23)
CO2: 29 meq/L (ref 19–32)
Calcium: 10.2 mg/dL (ref 8.4–10.5)
Chloride: 99 meq/L (ref 96–112)
Creatinine, Ser: 0.7 mg/dL (ref 0.40–1.50)
GFR: 128.78 mL/min
Glucose, Bld: 99 mg/dL (ref 70–99)
Potassium: 4.1 meq/L (ref 3.5–5.1)
Sodium: 139 meq/L (ref 135–145)
Total Bilirubin: 1.1 mg/dL (ref 0.2–1.2)
Total Protein: 7.8 g/dL (ref 6.0–8.3)

## 2024-10-24 LAB — B12 AND FOLATE PANEL
Folate: 10.8 ng/mL
Vitamin B-12: >1500 pg/mL — ABNORMAL HIGH (ref 211–911)

## 2024-10-24 LAB — PROTIME-INR
INR: 0.9 ratio (ref 0.8–1.0)
Prothrombin Time: 10.5 s (ref 9.6–13.1)

## 2024-10-24 LAB — EXTRA SPECIMEN

## 2024-10-24 LAB — MAGNESIUM: Magnesium: 1.8 mg/dL (ref 1.5–2.5)

## 2024-10-24 MED ORDER — DICYCLOMINE HCL 10 MG PO CAPS: 10.0000 mg | ORAL_CAPSULE | Freq: Three times a day (TID) | ORAL | 3 refills | Status: AC

## 2024-10-24 MED ORDER — OMEPRAZOLE 40 MG PO CPDR: 40.0000 mg | DELAYED_RELEASE_CAPSULE | Freq: Every day | ORAL | 3 refills | Status: AC

## 2024-10-24 MED ORDER — CELECOXIB 200 MG PO CAPS: 200.0000 mg | ORAL_CAPSULE | Freq: Two times a day (BID) | ORAL | 11 refills | Status: AC | PRN

## 2024-10-24 MED ORDER — MULTI-VITAMIN/MINERALS PO TABS: 1.0000 | ORAL_TABLET | Freq: Every day | ORAL | 3 refills | Status: AC

## 2024-10-24 NOTE — Progress Notes (Signed)
 "  Assessment  Assessment/Plan:  Assessment and Plan Assessment & Plan Mild protein-calorie malnutrition Chronic malnutrition with mild protein-calorie deficiency. BMI is 16. Recent illness may have exacerbated nutritional status. Omeprazole  use may contribute to malabsorption. - Recommended protein supplementation and multivitamin intake. - Ordered complete nutrition panel including zinc , selenium, copper, iron, TIBC, ferritin, TSH with reflex to free T4, urinalysis with micro and reflex to culture, CMP, blood glucose, hemoglobin A1c, CBC with differential, vitamins A, E, K, D, PT, INR, vitamin B complex, thiamin, riboflavin, niacin, pantothionic acid, vitamin B6, pyridoxal phosphate, biotin, folate, and B12. - Checked magnesium, albumin, and prealbumin levels.  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 Chest x-ray shows no signs of pulmonary infiltrate or active disease. Follow-up with rheumatology is ongoing. - Continue follow-up with rheumatology as needed.  Irritable bowel syndrome, mixed type Mixed type IBS with recent exacerbation of diarrhea and constipation, possibly related to recent antibiotic use. Abdominal pain present. Omeprazole  use may contribute to malabsorption. - Prescribed Bentyl  (dicyclomine ) 10 mg orally four times a day before meals and at bedtime for cramping. - Recommended trying pure whey protein isolate to assess tolerance.  Gastroesophageal reflux disease GERD managed with omeprazole  40 mg daily. Potential contribution to malabsorption issues. - Refilled omeprazole  prescription.  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 Occasional flare-ups, especially with physical activity. Current lower back soreness noted. - Prescribed celecoxib  200 mg twice a day for anti-inflammatory effect. - Encouraged continued physical activity and sports participation.  General Health  Maintenance Routine health maintenance discussed, including the importance of a balanced diet and multivitamin supplementation due to chronic inflammatory condition and potential functional deficiencies during illness. - Recommended starting a generic adult multivitamin and multimineral supplement.  Recording duration: 16 minutes     Medications Discontinued During This Encounter  Medication Reason   omeprazole  (PRILOSEC) 40 MG capsule Reorder   ibuprofen  (ADVIL ) 200 MG tablet     Patient Counseling(The following topics were reviewed and/or handout was given):  -Nutrition: Stressed importance of moderation in sodium/caffeine intake, saturated fat and cholesterol, caloric balance, sufficient intake of fresh fruits, vegetables, and fiber.  -Stressed the importance of regular exercise.   -Substance Abuse: Discussed cessation/primary prevention of tobacco, alcohol, or other drug use; driving or other dangerous activities under the influence; availability of treatment for abuse.   -Injury prevention: Discussed safety belts, safety helmets, smoke detector, smoking near bedding or upholstery.   -Sexuality: Discussed sexually transmitted diseases, partner selection, use of condoms, avoidance of unintended pregnancy and contraceptive alternatives.   -Dental health: Discussed importance of regular tooth brushing, flossing, and dental visits.  -Health maintenance and immunizations reviewed. Please refer to Health maintenance section.  Return in about 1 year (around 10/24/2025) for physical (fasting labs).        Subjective:   Encounter date: 10/24/2024  Chief Complaint  Patient presents with   Annual Exam    Pt presents today for cpe. Pt is fasting, no questions or concerns     Discussed the use of AI scribe software for clinical note transcription with the patient, who gave verbal consent to proceed.  History of Present Illness Daniel [name] is a 24 year old male who presents for an  annual physical exam.  Nutritional status and cutaneous sarcoidosis - Chronic malnutrition with ongoing follow-up in pulmonology and dermatology for cutaneous sarcoidosis. - Recent laboratory tests including ACE, CRP, and ESR were negative. - Not  currently taking a multivitamin. - Attempts to maintain protein intake through shakes, but has difficulty finding a suitable product that does not cause gastrointestinal upset.  Gastrointestinal symptoms - GERD managed with omeprazole  40 mg. - Irritable bowel syndrome symptoms fluctuate between diarrhea and constipation, attributed to recent antibiotic use. - Abdominal pain associated with bowel symptoms. - Previously tried Linzess  for constipation; has not used Bentyl  for cramping.  Recent respiratory illness - In December, experienced severe illness with rhinorrhea, fever, and persistent cough disrupting sleep for several days. - Visited multiple healthcare facilities during illness. - Symptoms have improved; residual chest soreness from coughing relieved by Advil .  Lower back pain and sciatica - Chronic lower back pain and sciatica with intermittent flares, especially after physical activity. - Pain described as shooting down right leg, occasionally affecting both legs and causing numbness. - History of steroid treatment for this condition. - Occasional soreness in lower back persists.  Physical activity - Active in basketball, volleyball, and swimming. 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       10/24/2024   10:18 AM 09/12/2024   10:43 AM 05/29/2023    9:26 AM 02/27/2023    1:31 PM 05/18/2022    2:24 PM  Depression screen PHQ 2/9  Decreased Interest 0 0 0 0 0  Down, Depressed, Hopeless 0 0 0 0 0  PHQ - 2 Score 0 0 0 0 0  Altered sleeping   0  0  Tired, decreased energy   0  0  Change in appetite   0  0  Feeling bad or failure about yourself    0  0  Trouble concentrating   0  0  Moving slowly or  fidgety/restless   0  0  Suicidal thoughts   0  0  PHQ-9 Score   0   0   Difficult doing work/chores   Not difficult at all  Not difficult at all     Data saved with a previous flowsheet row definition       05/29/2023    9:26 AM 05/18/2022    2:25 PM  GAD 7 : Generalized Anxiety Score  Nervous, Anxious, on Edge 0  0   Control/stop worrying 0  0   Worry too much - different things 0  0   Trouble relaxing 0  0   Restless 0  0   Easily annoyed or irritable 0  0   Afraid - awful might happen 0  0   Total GAD 7 Score 0 0  Anxiety Difficulty Not difficult at all Not difficult at all     Data saved with a previous flowsheet row definition    There are no preventive care reminders to display for this patient.     PMH:  The following were reviewed and entered/updated in epic: Past Medical History:  Diagnosis Date   Abdominal pain, recurrent    GERD (gastroesophageal reflux disease)    IBS (irritable bowel syndrome)    Premature birth    Sarcoidosis    Subcutaneous   Vomiting     Patient Active Problem List   Diagnosis Date Noted   Viral URI with cough 09/12/2024   Piriformis syndrome of right side 10/15/2023   Strep pharyngitis 07/11/2023   Non-recurrent acute suppurative otitis media of right ear without spontaneous rupture of tympanic membrane 07/11/2023   Acute non-recurrent frontal sinusitis 07/11/2023   Patellofemoral arthralgia of both knees 10/12/2022   Constipation 08/15/2022  History of concussion 05/18/2022   History of migraine 05/18/2022   Malnutrition 05/18/2022   Gastroesophageal reflux disease without esophagitis 04/28/2019   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    Past Surgical History:  Procedure Laterality Date   WISDOM TOOTH EXTRACTION      Family History  Problem Relation Age of Onset   Hyperlipidemia Father    Hypertension Maternal Grandmother    Sarcoidosis Maternal Grandmother    Diabetes Maternal Grandmother    Hyperlipidemia  Maternal Grandmother    COPD Maternal Grandmother    Asthma Maternal Grandmother    Prostate cancer Maternal Grandfather    Neuropathy Maternal Grandfather    Cancer Maternal Grandfather    GER disease Paternal Grandmother    Hypertension Paternal Grandmother    Hyperlipidemia Paternal Grandmother    Thyroid disease Paternal Grandmother    Heart attack Paternal Grandfather    Heart disease Paternal Grandfather    Colon cancer Neg Hx    Esophageal cancer Neg Hx    Rectal cancer Neg Hx    Stomach cancer Neg Hx     Medications- reviewed and updated Outpatient Medications Prior to Visit  Medication Sig Dispense Refill   acetaminophen  (TYLENOL ) 325 MG tablet Take by mouth as needed.     fluticasone  (FLONASE ) 50 MCG/ACT nasal spray Place 2 sprays into both nostrils daily. 16 g 6   Probiotic Product (PROBIOTIC DAILY PO) Take by mouth.     triamcinolone  cream (KENALOG ) 0.1 % Apply 1 Application topically 2 (two) times daily as needed. 60 g 0   ibuprofen  (ADVIL ) 200 MG tablet Take by mouth as needed.     omeprazole  (PRILOSEC) 40 MG capsule TAKE 1 CAPSULE (40 MG TOTAL) BY MOUTH DAILY. 90 capsule 1   No facility-administered medications prior to visit.    Allergies[1]  Social History   Socioeconomic History   Marital status: Single    Spouse name: Not on file   Number of children: Not on file   Years of education: Not on file   Highest education level: Bachelor's degree (e.g., BA, AB, BS)  Occupational History   Not on file  Tobacco Use   Smoking status: Never    Passive exposure: Never   Smokeless tobacco: Never  Vaping Use   Vaping status: Never Used  Substance and Sexual Activity   Alcohol use: Never   Drug use: Never   Sexual activity: Never  Other Topics Concern   Not on file  Social History Narrative   Not on file   Social Drivers of Health   Tobacco Use: Low Risk (10/14/2024)   Patient History    Smoking Tobacco Use: Never    Smokeless Tobacco Use: Never     Passive Exposure: Never  Financial Resource Strain: Low Risk (09/02/2024)   Overall Financial Resource Strain (CARDIA)    Difficulty of Paying Living Expenses: Not hard at all  Food Insecurity: No Food Insecurity (09/02/2024)   Epic    Worried About Programme Researcher, Broadcasting/film/video in the Last Year: Never true    Ran Out of Food in the Last Year: Never true  Transportation Needs: No Transportation Needs (09/02/2024)   Epic    Lack of Transportation (Medical): No    Lack of Transportation (Non-Medical): No  Physical Activity: Insufficiently Active (09/02/2024)   Exercise Vital Sign    Days of Exercise per Week: 3 days    Minutes of Exercise per Session: 30 min  Stress: No Stress Concern Present (09/02/2024)  [employer] of Occupational Health - Occupational Stress Questionnaire    Feeling of Stress: Only a little  Social Connections: Moderately Isolated (09/02/2024)   Social Connection and Isolation Panel    Frequency of Communication with Friends and Family: More than three times a week    Frequency of Social Gatherings with Friends and Family: More than three times a week    Attends Religious Services: More than 4 times per year    Active Member of Clubs or Organizations: No    Attends Banker Meetings: Not on file    Marital Status: Never married  Depression (PHQ2-9): Low Risk (10/24/2024)   Depression (PHQ2-9)    PHQ-2 Score: 0  Alcohol Screen: Not on file  Housing: Unknown (09/02/2024)   Epic    Unable to Pay for Housing in the Last Year: No    Number of Times Moved in the Last Year: Not on file    Homeless in the Last Year: No  Utilities: Not on file  Health Literacy: Not on file           Objective:  Physical Exam: BP 117/85   Pulse 77   Temp 97.9 F (36.6 C)   Ht 5' 5 (1.651 m)   Wt 97 lb 3.2 oz (44.1 kg)   SpO2 99%   BMI 16.17 kg/m   Body mass index is 16.17 kg/m. Wt Readings from Last 3 Encounters:  10/24/24 97 lb 3.2 oz (44.1 kg)  10/14/24 97 lb 12  oz (44.3 kg)  09/19/24 96 lb 6.4 oz (43.7 kg)    Physical Exam MEASUREMENTS: BMI- 16.0. GENERAL: Alert, cooperative, well developed, no acute distress. HEENT: Normocephalic, normal oropharynx, moist mucous membranes. NECK: No thyroid nodules. CHEST: Clear to auscultation bilaterally, no wheezes, rhonchi, or crackles. CARDIOVASCULAR: Normal heart rate and rhythm, S1 and S2 normal without murmurs. ABDOMEN: Soft, non-tender, non-distended, without organomegaly, normal bowel sounds. EXTREMITIES: No cyanosis or edema. MUSCULOSKELETAL: Lower back pain on palpation. NEUROLOGICAL: Cranial nerves grossly intact, moves all extremities without gross motor or sensory deficit.  Physical Exam      Prior labs:   Recent Results (from the past 2160 hours)  POC COVID-19     Status: Normal   Collection Time: 09/12/24 11:15 AM  Result Value Ref Range   SARS Coronavirus 2 Ag Negative Negative  POCT Influenza A/B     Status: Normal   Collection Time: 09/12/24 11:15 AM  Result Value Ref Range   Influenza A, POC Negative Negative   Influenza B, POC Negative Negative  POC COVID-19 BinaxNow     Status: Normal   Collection Time: 09/16/24  4:25 PM  Result Value Ref Range   SARS Coronavirus 2 Ag Negative Negative  POCT rapid strep A     Status: Normal   Collection Time: 09/16/24  4:25 PM  Result Value Ref Range   Rapid Strep A Screen Negative Negative  POCT Influenza A/B     Status: Normal   Collection Time: 09/16/24  4:25 PM  Result Value Ref Range   Influenza A, POC Negative Negative   Influenza B, POC Negative Negative  CBC with Differential/Platelet     Status: Abnormal   Collection Time: 09/19/24  3:36 PM  Result Value Ref Range   WBC 4.7 3.8 - 10.8 Thousand/uL   RBC 4.99 4.20 - 5.80 Million/uL   Hemoglobin 15.7 13.2 - 17.1 g/dL   HCT 55.7 60.5 - 48.8 %   MCV 88.6 81.4 - 101.7  fL   MCH 31.5 27.0 - 33.0 pg   MCHC 35.5 (H) 31.6 - 35.4 g/dL   RDW 87.7 88.9 - 84.9 %   Platelets 237 140 -  400 Thousand/uL   MPV 10.1 7.5 - 12.5 fL   Neutro Abs 2,308 1,500 - 7,800 cells/uL   Absolute Lymphocytes 1,814 850 - 3,900 cells/uL   Absolute Monocytes 569 200 - 950 cells/uL   Eosinophils Absolute 0 (L) 15 - 500 cells/uL   Basophils Absolute 9 0 - 200 cells/uL   Neutrophils Relative % 49.1 %   Total Lymphocyte 38.6 %   Monocytes Relative 12.1 %   Eosinophils Relative 0.0 %   Basophils Relative 0.2 %    Lab Results  Component Value Date   CHOL 166 05/25/2022   Lab Results  Component Value Date   HDL 54.50 05/25/2022   Lab Results  Component Value Date   LDLCALC 94 05/25/2022   Lab Results  Component Value Date   TRIG 87.0 05/25/2022   Lab Results  Component Value Date   CHOLHDL 3 05/25/2022   No results found for: LDLDIRECT  Last metabolic panel Lab Results  Component Value Date   GLUCOSE 90 05/29/2023   NA 138 05/29/2023   K 4.5 05/29/2023   CL 101 05/29/2023   CO2 29 05/29/2023   BUN 7 05/29/2023   CREATININE 0.83 05/29/2023   GFR 123.54 05/29/2023   CALCIUM 9.7 05/29/2023   PROT 6.9 05/29/2023   ALBUMIN 4.6 05/29/2023   BILITOT 1.1 05/29/2023   ALKPHOS 68 05/29/2023   AST 18 05/29/2023   ALT 9 05/29/2023    Lab Results  Component Value Date   HGBA1C 4.9 05/29/2023    Last CBC Lab Results  Component Value Date   WBC 4.7 09/19/2024   HGB 15.7 09/19/2024   HCT 44.2 09/19/2024   MCV 88.6 09/19/2024   MCH 31.5 09/19/2024   RDW 12.2 09/19/2024   PLT 237 09/19/2024    No results found for: TSH  No results found for: PSA1, PSA  Last vitamin D  No results found for: MARIEN BOLLS, VD25OH  Lab Results  Component Value Date   BILIRUBINUR NEGATIVE 05/29/2023   PROTEINUR NEG 06/19/2011   UROBILINOGEN 1.0 05/29/2023   LEUKOCYTESUR NEGATIVE 05/29/2023    No results found for: LABMICR, MICROALBUR   At today's visit, we discussed treatment options, associated risk and benefits, and engage in counseling as needed.   Additionally the following were reviewed: Past medical records, past medical and surgical history, family and social background, as well as relevant laboratory results, imaging findings, and specialty notes, where applicable.  This message was generated using dictation software, and as a result, it may contain unintentional typos or errors.  Nevertheless, extensive effort was made to accurately convey at the pertinent aspects of the patient visit.    There may have been are other unrelated non-urgent complaints, but due to the busy schedule and the amount of time already spent with him, time does not permit to address these issues at today's visit. Another appointment may have or has been requested to review these additional issues.     Arvella Hummer, MD, MS I,Emily Lagle,acting as a scribe for Beverley KATHEE Hummer, MD.,have documented all relevant documentation on the behalf of Beverley KATHEE Hummer, MD.  LILLETTE Beverley KATHEE Hummer, MD, have reviewed all documentation for this visit. The documentation on 10/24/2024 for the exam, diagnosis, procedures, and orders are all accurate and complete.     [  1] No Known Allergies  "

## 2024-10-24 NOTE — Patient Instructions (Addendum)
 It was very nice to see you today!  VISIT SUMMARY: Today, you had your annual physical exam. We discussed your nutritional status, gastrointestinal symptoms, recent respiratory illness, lower back pain, and overall health maintenance.  YOUR PLAN: MILD PROTEIN-CALORIE MALNUTRITION: You have mild protein-calorie deficiency, possibly worsened by recent illness and omeprazole  use. -Start taking a multivitamin and consider protein supplementation. -We ordered a complete nutrition panel to check various levels including vitamins and minerals.  CUTANEOUS SARCOIDOSIS: Your recent tests for sarcoidosis were negative, and your chest x-ray showed no signs of active disease. -Continue follow-up with rheumatology as needed.  IRRITABLE BOWEL SYNDROME, MIXED TYPE: You have mixed type IBS with recent symptoms of diarrhea and constipation, possibly due to recent antibiotic use. -Take Bentyl  (dicyclomine ) 10 mg four times a day before meals and at bedtime for cramping. -Try pure whey protein isolate to see if you can tolerate it.  GASTROESOPHAGEAL REFLUX DISEASE: Your GERD is managed with omeprazole , which may contribute to malabsorption issues. -Continue taking omeprazole  40 mg daily.  CHRONIC LOW BACK PAIN WITH RIGHT SCIATICA: You have chronic low back pain with right sciatica, with occasional flare-ups, especially after physical activity. -Take celecoxib  200 mg twice a day for inflammation. -Continue participating in physical activities and sports.  GENERAL HEALTH MAINTENANCE: We discussed the importance of a balanced diet and multivitamin supplementation due to your chronic inflammatory condition. -Start taking a generic adult multivitamin and multimineral supplement.  Return in about 1 year (around 10/24/2025) for physical (fasting labs).   Take care, Arvella Hummer, MD, MS   PLEASE NOTE:  If you had any lab tests, please let us  know if you have not heard back within a few days. You may see your  results on mychart before we have a chance to review them but we will give you a call once they are reviewed by us .   If we ordered any referrals today, please let us  know if you have not heard from their office within the next week.   If you had any urgent prescriptions sent in today, please check with the pharmacy within an hour of our visit to make sure the prescription was transmitted appropriately.   Please try these tips to maintain a healthy lifestyle:  Eat at least 3 REAL meals and 1-2 snacks per day.  Aim for no more than 5 hours between eating.  If you eat breakfast, please do so within one hour of getting up.   Each meal should contain half fruits/vegetables, one quarter protein, and one quarter carbs (no bigger than a computer mouse)  Cut down on sweet beverages. This includes juice, soda, and sweet tea.   Drink at least 1 glass of water with each meal and aim for at least 8 glasses per day  Exercise at least 150 minutes every week.

## 2024-10-25 LAB — TSH RFX ON ABNORMAL TO FREE T4: TSH: 2.25 u[IU]/mL (ref 0.450–4.500)

## 2024-10-25 LAB — URINALYSIS W MICROSCOPIC + REFLEX CULTURE
Bacteria, UA: NONE SEEN /HPF
Bilirubin Urine: NEGATIVE
Glucose, UA: NEGATIVE
Hgb urine dipstick: NEGATIVE
Hyaline Cast: NONE SEEN /LPF
Ketones, ur: NEGATIVE
Leukocyte Esterase: NEGATIVE
Nitrites, Initial: NEGATIVE
Protein, ur: NEGATIVE
RBC / HPF: NONE SEEN /HPF (ref 0–2)
Specific Gravity, Urine: 1.009 (ref 1.001–1.035)
Squamous Epithelial / HPF: NONE SEEN /HPF
WBC, UA: NONE SEEN /HPF (ref 0–5)
pH: 7 (ref 5.0–8.0)

## 2024-10-25 LAB — IRON,TIBC AND FERRITIN PANEL
%SAT: 47 % (ref 20–48)
Ferritin: 105 ng/mL (ref 38–380)
Iron: 145 ug/dL (ref 50–195)
TIBC: 307 ug/dL (ref 250–425)

## 2024-10-25 LAB — NO CULTURE INDICATED

## 2024-10-27 LAB — VITAMIN K1, SERUM: Vitamin K: 1160 pg/mL (ref 130–1500)

## 2024-10-27 LAB — PREALBUMIN: Prealbumin: 36 mg/dL (ref 21–43)

## 2024-10-30 ENCOUNTER — Ambulatory Visit: Payer: Self-pay | Admitting: Family Medicine

## 2024-10-30 LAB — VITAMIN A: Vitamin A (Retinoic Acid): 68 ug/dL (ref 38–98)

## 2024-10-30 LAB — VITAMIN D 1,25 DIHYDROXY
Vitamin D 1, 25 (OH)2 Total: 62 pg/mL (ref 18–72)
Vitamin D2 1, 25 (OH)2: <8 pg/mL
Vitamin D3 1, 25 (OH)2: 62 pg/mL

## 2024-10-30 LAB — VITAMIN E
Gamma-Tocopherol (Vit E): 2.5 mg/L
Vitamin E (Alpha Tocopherol): 11 mg/L (ref 5.7–19.9)

## 2025-10-14 ENCOUNTER — Ambulatory Visit: Admitting: Internal Medicine

## 2025-10-26 ENCOUNTER — Encounter: Admitting: Family Medicine
